# Patient Record
Sex: Female | Born: 1955 | Race: Black or African American | Hispanic: No | Marital: Married | Smoking: Never smoker
Health system: Southern US, Community
[De-identification: ages and names within clinical notes are randomized; demographics above are authoritative.]

## PROBLEM LIST (undated history)

## (undated) DIAGNOSIS — R7303 Prediabetes: Secondary | ICD-10-CM

## (undated) DIAGNOSIS — G4730 Sleep apnea, unspecified: Secondary | ICD-10-CM

## (undated) DIAGNOSIS — J302 Other seasonal allergic rhinitis: Secondary | ICD-10-CM

## (undated) DIAGNOSIS — D649 Anemia, unspecified: Secondary | ICD-10-CM

## (undated) DIAGNOSIS — E785 Hyperlipidemia, unspecified: Secondary | ICD-10-CM

## (undated) HISTORY — PX: OTHER SURGICAL HISTORY: SHX169

## (undated) HISTORY — PX: TONSILLECTOMY: SUR1361

## (undated) HISTORY — PX: TUBAL LIGATION: SHX77

---

## 2002-11-11 ENCOUNTER — Ambulatory Visit (HOSPITAL_COMMUNITY): Admission: RE | Admit: 2002-11-11 | Discharge: 2002-11-11 | Payer: Self-pay | Admitting: Obstetrics and Gynecology

## 2002-11-11 ENCOUNTER — Encounter: Payer: Self-pay | Admitting: Obstetrics and Gynecology

## 2003-11-15 ENCOUNTER — Ambulatory Visit (HOSPITAL_COMMUNITY): Admission: RE | Admit: 2003-11-15 | Discharge: 2003-11-15 | Payer: Self-pay | Admitting: Obstetrics and Gynecology

## 2005-01-02 ENCOUNTER — Ambulatory Visit (HOSPITAL_COMMUNITY): Admission: RE | Admit: 2005-01-02 | Discharge: 2005-01-02 | Payer: Self-pay | Admitting: Obstetrics and Gynecology

## 2005-11-11 ENCOUNTER — Emergency Department (HOSPITAL_COMMUNITY): Admission: EM | Admit: 2005-11-11 | Discharge: 2005-11-11 | Payer: Self-pay | Admitting: Emergency Medicine

## 2006-02-12 HISTORY — PX: COLONOSCOPY: SHX174

## 2006-04-05 ENCOUNTER — Ambulatory Visit (HOSPITAL_COMMUNITY): Admission: RE | Admit: 2006-04-05 | Discharge: 2006-04-05 | Payer: Self-pay | Admitting: Obstetrics and Gynecology

## 2006-04-19 ENCOUNTER — Ambulatory Visit (HOSPITAL_COMMUNITY): Admission: RE | Admit: 2006-04-19 | Discharge: 2006-04-19 | Payer: Self-pay | Admitting: Obstetrics and Gynecology

## 2006-06-12 ENCOUNTER — Ambulatory Visit (HOSPITAL_COMMUNITY): Admission: RE | Admit: 2006-06-12 | Discharge: 2006-06-12 | Payer: Self-pay | Admitting: Gastroenterology

## 2007-01-16 ENCOUNTER — Encounter: Admission: RE | Admit: 2007-01-16 | Discharge: 2007-01-16 | Payer: Self-pay | Admitting: Family Medicine

## 2007-01-24 ENCOUNTER — Encounter: Admission: RE | Admit: 2007-01-24 | Discharge: 2007-01-24 | Payer: Self-pay | Admitting: Family Medicine

## 2007-04-07 ENCOUNTER — Ambulatory Visit (HOSPITAL_COMMUNITY): Admission: RE | Admit: 2007-04-07 | Discharge: 2007-04-07 | Payer: Self-pay | Admitting: Obstetrics and Gynecology

## 2008-02-04 ENCOUNTER — Encounter: Admission: RE | Admit: 2008-02-04 | Discharge: 2008-02-04 | Payer: Self-pay | Admitting: Family Medicine

## 2008-04-01 ENCOUNTER — Inpatient Hospital Stay (HOSPITAL_COMMUNITY): Admission: EM | Admit: 2008-04-01 | Discharge: 2008-04-02 | Payer: Self-pay | Admitting: Emergency Medicine

## 2008-05-19 ENCOUNTER — Ambulatory Visit (HOSPITAL_COMMUNITY): Admission: RE | Admit: 2008-05-19 | Discharge: 2008-05-19 | Payer: Self-pay | Admitting: Obstetrics and Gynecology

## 2008-09-10 ENCOUNTER — Encounter: Admission: RE | Admit: 2008-09-10 | Discharge: 2008-09-10 | Payer: Self-pay | Admitting: Family Medicine

## 2008-09-22 ENCOUNTER — Encounter (INDEPENDENT_AMBULATORY_CARE_PROVIDER_SITE_OTHER): Payer: Self-pay | Admitting: Interventional Radiology

## 2008-09-22 ENCOUNTER — Encounter: Admission: RE | Admit: 2008-09-22 | Discharge: 2008-09-22 | Payer: Self-pay | Admitting: Family Medicine

## 2008-09-22 ENCOUNTER — Other Ambulatory Visit: Admission: RE | Admit: 2008-09-22 | Discharge: 2008-09-22 | Payer: Self-pay | Admitting: Interventional Radiology

## 2009-06-16 ENCOUNTER — Ambulatory Visit (HOSPITAL_COMMUNITY): Admission: RE | Admit: 2009-06-16 | Discharge: 2009-06-16 | Payer: Self-pay | Admitting: Obstetrics and Gynecology

## 2009-07-04 ENCOUNTER — Ambulatory Visit (HOSPITAL_COMMUNITY): Admission: RE | Admit: 2009-07-04 | Discharge: 2009-07-04 | Payer: Self-pay | Admitting: Obstetrics and Gynecology

## 2010-03-04 ENCOUNTER — Encounter: Payer: Self-pay | Admitting: Obstetrics and Gynecology

## 2010-03-05 ENCOUNTER — Encounter: Payer: Self-pay | Admitting: Obstetrics and Gynecology

## 2010-03-05 ENCOUNTER — Encounter: Payer: Self-pay | Admitting: Family Medicine

## 2010-03-05 ENCOUNTER — Encounter: Payer: Self-pay | Admitting: Internal Medicine

## 2010-03-06 ENCOUNTER — Encounter: Payer: Self-pay | Admitting: Family Medicine

## 2010-05-01 ENCOUNTER — Other Ambulatory Visit: Payer: Self-pay | Admitting: Family Medicine

## 2010-05-01 DIAGNOSIS — E049 Nontoxic goiter, unspecified: Secondary | ICD-10-CM

## 2010-05-05 ENCOUNTER — Ambulatory Visit
Admission: RE | Admit: 2010-05-05 | Discharge: 2010-05-05 | Disposition: A | Discharge status: Home / Self Care | Payer: BC Managed Care – PPO | Source: Ambulatory Visit | Attending: Family Medicine | Admitting: Family Medicine

## 2010-05-05 DIAGNOSIS — E049 Nontoxic goiter, unspecified: Secondary | ICD-10-CM

## 2010-05-30 LAB — CARDIAC PANEL(CRET KIN+CKTOT+MB+TROPI)
CK, MB: 1 ng/mL (ref 0.3–4.0)
CK, MB: 1.3 ng/mL (ref 0.3–4.0)
CK, MB: 1.4 ng/mL (ref 0.3–4.0)
Relative Index: 0.7 (ref 0.0–2.5)
Relative Index: 0.8 (ref 0.0–2.5)
Relative Index: 0.8 (ref 0.0–2.5)
Total CK: 143 U/L (ref 7–177)
Total CK: 161 U/L (ref 7–177)
Total CK: 177 U/L (ref 7–177)
Troponin I: <0.01 ng/mL (ref 0.00–0.06)
Troponin I: <0.01 ng/mL (ref 0.00–0.06)
Troponin I: <0.01 ng/mL (ref 0.00–0.06)

## 2010-05-30 LAB — COMPREHENSIVE METABOLIC PANEL
ALT: 28 U/L (ref 0–35)
AST: 19 U/L (ref 0–37)
Albumin: 3.7 g/dL (ref 3.5–5.2)
Alkaline Phosphatase: 62 U/L (ref 39–117)
BUN: 10 mg/dL (ref 6–23)
CO2: 28 mEq/L (ref 19–32)
Calcium: 9.3 mg/dL (ref 8.4–10.5)
Chloride: 103 mEq/L (ref 96–112)
Creatinine, Ser: 0.78 mg/dL (ref 0.4–1.2)
GFR calc Af Amer: >60 mL/min (ref >60)
GFR calc non Af Amer: >60 mL/min (ref >60)
Glucose, Bld: 89 mg/dL (ref 70–99)
Potassium: 3.8 mEq/L (ref 3.5–5.1)
Sodium: 137 mEq/L (ref 135–145)
Total Bilirubin: 0.5 mg/dL (ref 0.3–1.2)
Total Protein: 6.5 g/dL (ref 6.0–8.3)

## 2010-05-30 LAB — POCT I-STAT, CHEM 8
BUN: 10 mg/dL (ref 6–23)
Calcium, Ion: 1.15 mmol/L (ref 1.12–1.32)
Chloride: 105 mEq/L (ref 96–112)
Creatinine, Ser: 0.9 mg/dL (ref 0.4–1.2)
Glucose, Bld: 90 mg/dL (ref 70–99)
HCT: 41 % (ref 36.0–46.0)
Hemoglobin: 13.9 g/dL (ref 12.0–15.0)
Potassium: 4.2 mEq/L (ref 3.5–5.1)
Sodium: 140 mEq/L (ref 135–145)
TCO2: 27 mmol/L (ref 0–100)

## 2010-05-30 LAB — PROTIME-INR
INR: 1 (ref 0.00–1.49)
INR: 1 (ref 0.00–1.49)
Prothrombin Time: 13 seconds (ref 11.6–15.2)
Prothrombin Time: 13.3 seconds (ref 11.6–15.2)

## 2010-05-30 LAB — CBC
HCT: 38.6 % (ref 36.0–46.0)
HCT: 40.1 % (ref 36.0–46.0)
Hemoglobin: 12.8 g/dL (ref 12.0–15.0)
Hemoglobin: 13.4 g/dL (ref 12.0–15.0)
MCHC: 33.1 g/dL (ref 30.0–36.0)
MCHC: 33.5 g/dL (ref 30.0–36.0)
MCV: 81.1 fL (ref 78.0–100.0)
MCV: 81.2 fL (ref 78.0–100.0)
Platelets: 255 10*3/uL (ref 150–400)
Platelets: 271 10*3/uL (ref 150–400)
RBC: 4.76 MIL/uL (ref 3.87–5.11)
RBC: 4.94 MIL/uL (ref 3.87–5.11)
RDW: 14.4 % (ref 11.5–15.5)
RDW: 15.3 % (ref 11.5–15.5)
WBC: 5.8 10*3/uL (ref 4.0–10.5)
WBC: 6.9 10*3/uL (ref 4.0–10.5)

## 2010-05-30 LAB — DIFFERENTIAL
Basophils Absolute: 0 10*3/uL (ref 0.0–0.1)
Basophils Relative: 0 % (ref 0–1)
Eosinophils Absolute: 0.2 10*3/uL (ref 0.0–0.7)
Eosinophils Relative: 3 % (ref 0–5)
Lymphocytes Relative: 43 % (ref 12–46)
Lymphs Abs: 2.9 10*3/uL (ref 0.7–4.0)
Monocytes Absolute: 0.5 10*3/uL (ref 0.1–1.0)
Monocytes Relative: 7 % (ref 3–12)
Neutro Abs: 3.2 10*3/uL (ref 1.7–7.7)
Neutrophils Relative %: 47 % (ref 43–77)

## 2010-05-30 LAB — HEMOGLOBIN A1C
Hgb A1c MFr Bld: 6.3 % — ABNORMAL HIGH (ref 4.6–6.1)
Mean Plasma Glucose: 134 mg/dL

## 2010-05-30 LAB — LIPID PANEL
Cholesterol: 219 mg/dL — ABNORMAL HIGH (ref 0–200)
HDL: 50 mg/dL (ref >39)
LDL Cholesterol: 150 mg/dL — ABNORMAL HIGH (ref 0–99)
Total CHOL/HDL Ratio: 4.4 RATIO
Triglycerides: 93 mg/dL (ref <150)
VLDL: 19 mg/dL (ref 0–40)

## 2010-05-30 LAB — POCT CARDIAC MARKERS
CKMB, poc: <1 ng/mL — ABNORMAL LOW (ref 1.0–8.0)
Myoglobin, poc: 83.5 ng/mL (ref 12–200)
Troponin i, poc: <0.05 ng/mL (ref 0.00–0.09)

## 2010-05-30 LAB — CK TOTAL AND CKMB (NOT AT ARMC)
CK, MB: 1.8 ng/mL (ref 0.3–4.0)
Relative Index: 0.7 (ref 0.0–2.5)
Total CK: 243 U/L — ABNORMAL HIGH (ref 7–177)

## 2010-05-30 LAB — APTT: aPTT: 35 seconds (ref 24–37)

## 2010-05-30 LAB — TSH: TSH: 0.91 u[IU]/mL (ref 0.350–4.500)

## 2010-05-30 LAB — BRAIN NATRIURETIC PEPTIDE: Pro B Natriuretic peptide (BNP): <30 pg/mL (ref 0.0–100.0)

## 2010-05-30 LAB — D-DIMER, QUANTITATIVE: D-Dimer, Quant: 0.31 ug/mL-FEU (ref 0.00–0.48)

## 2010-05-30 LAB — TROPONIN I: Troponin I: 0.01 ng/mL (ref 0.00–0.06)

## 2010-06-27 NOTE — H&P (Signed)
NAMEALEXANDRA, [name] NO.:  [id number]   MEDICAL RECORD NO.:  [id number]          PATIENT TYPE:  EMS   LOCATION:  ED                           FACILITY[facility]  [facility]   PHYSICIAN:  Michiel Cowboy, MDDATE OF BIRTH:  [birth year]   DATE OF ADMISSION:  04/01/2008  DATE OF DISCHARGE:                              HISTORY & PHYSICAL   PRIMARY [name]:  Dr. Donia Guiles.   CHIEF COMPLAINT:  Chest pain.   The patient is a 55 year old female with past medical history  significant for chronic sinusitis.  The patient was in her baseline of  health up until she woke up this a.m. not feeling quite herself,  uncomfortable.  She noticed that she had pressure-like sensation mainly  over the right side of her chest which did not improve until she came to  the emergency department where she just received aspirin and  nitroglycerin, as well as 100 of fentanyl and 30 of Toradol, which made  her chest pain free.  She denies any nausea or vomiting.  No  diaphoresis.  No radiation of her pain.  She never has pain or shortness  of breath when she exercises and has not had any shortness of breath  associated with this pain.   SOCIAL HISTORY:  She never smoked and does not use drugs.  Occasional  drinker.   FAMILY HISTORY:  Father with myocardial infarction in his 71s.  No other  family members with coronary artery disease.   ALLERGIES:  NO KNOWN DRUG ALLERGIES.   MEDICATIONS:  Allegra D daily.   PHYSICAL EXAMINATION:  VITAL SIGNS:  Temperature 98.2, blood pressure  156/86, pulse 81, respirations 20.  Saturating 97% on room air.  GENERAL:  The patient appears to be currently in no acute distress,  slightly obese female, lying down on the bed.  HEENT:  Head nontraumatic.  Moist mucous membranes.  LUNGS:  Clear to auscultation bilaterally.  HEART:  Regular rate and rhythm.  No murmurs, rubs or gallops.  ABDOMEN:  Soft, nontender, nondistended.  Slightly obese.  LOWER EXTREMITIES:   No clubbing, cyanosis or edema.  NEUROLOGIC:  The patient appears to be intact.   LABORATORY DATA:  White blood cell count 6.9, hemoglobin 13.9, sodium  140, potassium 4.2, creatinine 0.9.  Cardiac enzymes are negative.  D-  dimer is 0.31.  EKG showing T-wave inversions in lead 3, aVF, V3-V6.  No  old EKGs available.  Chest x-ray showed no cardiopulmonary disease.   ASSESSMENT/PLAN:  1. This is a 55 year old female with risk factors concerning family      history of coronary artery disease presents with somewhat atypical      chest pain occurring at rest.  We will cycle cardiac enzymes, check      fasting lipid panel, hemoglobin A1c, TSH.  We will do serial EKGs.      We will consider cardiology consult given family history of      coronary artery disease and pressure-like sensation to see if a      stress test can be arranged sooner or later.  2. Elevated blood pressure.  This was an initial reading.  On repeat      reading it came down to 120s.  Will follow.  The patient has no      known history of hypertension.  3. Chronic sinus drainage.  Will change to Allegra D while here with      chest pain to avoid pseudoephedrine.  4. Prophylaxis.  Protonix plus Lovenox.  Will admit to telemetry.      Michiel Cowboy, MD  Electronically Signed     AVD/MEDQ  D:  04/01/2008  T:  04/01/2008  Job:  [id number]   cc:   Donia Guiles, M.D.  Fax: [phone]

## 2010-06-27 NOTE — Consult Note (Signed)
[name], [name] NO.:  [id number]   MEDICAL RECORD NO.:  [id number]          PATIENT TYPE:  INP   LOCATION:  0102                         FACILITY:  [facility]   PHYSICIAN:  Jake Bathe, MD      DATE OF BIRTH:  01/16/1956   DATE OF CONSULTATION:  04/01/2008  DATE OF DISCHARGE:                                 CONSULTATION   REQUESTING PHYSICIAN:  Dr. Ramiro Snyder of [place] [facility].   PRIMARY CARE PHYSICIAN:  [name], M.D. of [facility].   REASON FOR CONSULTATION:  [name] is being seen at the request of Dr.  Janee Snyder for the evaluation of chest pain and abnormal EKG.   HISTORY OF PRESENT ILLNESS:  [name] with family  history of coronary artery disease with her father having an MI and  bypass surgery in his early 14s with hypertension today, normal  cholesterol per the patient, no smoking, no diabetes who was admitted  today after waking up with severe 9/10 right-sided chest discomfort that  was worse with sitting up, associated with no diaphoresis, no nausea,  vomiting, no shortness of breath.  She experienced similar pain  previously but it was quite mild in intensity and is quite rare.  This  pain was non exertional.  When she got to the emergency department she  was given nitroglycerin, fentanyl, Toradol which relieved her chest  discomfort.  Her ECG was noteworthy for nonspecific ST-T wave changes  with T-wave inversions noted in leads II, III, AVF as well as V3, V4, V5  and V6.  These T-wave inversions were subtle.  The subsequent ECG showed  similar findings.  There is no old EKG for comparison.  Currently she  has very mild discomfort and is quite anxious about getting a room.  She  states that in her life right now there has been severe increased stress  and she feels as though this may be attributing to her chest pain.   PAST MEDICAL HISTORY:  1. Possible hypertension.  2. Chronic sinusitis.   MEDICATIONS:  Allegra-D only.   ALLERGIES:  No known drug allergies.   FAMILY HISTORY:  Father myocardial infarction in his 16s.   SOCIAL HISTORY:  No tobacco, alcohol or illicit drug use.  She works for  [employer].   REVIEW OF SYSTEMS:  Increased emotional stress. No bleeding, no syncope,  no rash, no orthopnea. Unless stated above, all other 12 review of  systems negative.   PHYSICAL EXAM:  Blood pressure on arrival was 156/86, pulse 81,  respirations 20, satting 97% on room air.  GENERAL:  Alert, oriented x3 in no acute distress.  Here with her  husband at bedside.  EYES:  Well-perfused conjunctivae.  EOMI.  No scleral icterus noted.  NECK:  Supple.  No lymphadenopathy.  No JVD.  No carotid bruits.  No  thyromegaly.  Moist mucous membranes.  CARDIOVASCULAR:  Regular rate and rhythm with no murmurs, rubs or  gallops appreciated.  Normal PMI.  LUNGS:  Clear to auscultation bilaterally.  Normal respiratory effort.  No wheezes.  No rales.  ABDOMEN:  Soft, nontender, normoactive bowel sounds.  Mildly obese.  No  bruits.  EXTREMITIES:  No clubbing, cyanosis or edema.  Normal distal pulses.  SKIN:  Warm, dry, intact.  No rashes.  NEUROLOGIC:  Nonfocal.  No tremors.  PSYCH:  Normal affect.   DATA:  ECG personally viewed as described in HPI shows sinus rhythm with  subtle T-wave inversion in lead II, III, F as well as V3, V4, V5, V6.  D-  dimer was normal at 0.31.  White count of 6.9, hemoglobin 13.9,  platelets 278.  Sodium 140, potassium 4.2, creatinine 0.9.  Chest x-ray  personally viewed showed no acute airspace disease.  First set of  cardiac biomarkers are normal.  This was drawn at 6:00 a.m.   ASSESSMENT/PLAN:  [name] with possible hypertension,  strong family history of coronary artery disease with atypical chest  pain.  1. Atypical chest pain - encouraged her to stay here for complete set      of evaluation of cardiac biomarkers every 8  hours x3.  I do agree      with Lovenox deep vein thrombosis prophylaxis and would change this      if her biomarkers increase.  Currently her chest pain story is      quite atypical and is likely secondary to stress episode and likely      musculoskeletal.  However, cardiac etiology needs to be ruled out.      If she does have unremarkable cardiac biomarkers and her ECG      remains unchanged in the morning, I would be fine with discharging      the patient for outpatient stress evaluation.  Of course if she      develops any more worrisome symptoms or any other worrisome      changes,  this evaluation or cardiac catheterization can be      performed more promptly or urgently.  2. Hypertension - her blood pressure was elevated.  This may be pain      response.  I will go ahead and give her metoprolol not only for      possible ischemia but also to help control her blood pressure.  3. Family history of coronary artery disease - quite strong as notated      above.  Waiting on fasting lipid profile.  Aggressively modify      other risk factors.      Jake Bathe, MD  Electronically Signed     MCS/MEDQ  D:  04/01/2008  T:  04/01/2008  Job:  [id number]   cc:   [name] Harvest, MD   [name], M.D.  Fax: [phone]

## 2010-06-30 NOTE — Op Note (Signed)
NAMECHRYSTEL, [name] NO.:  [id number]   MEDICAL RECORD NO.:  [id number]          PATIENT TYPE:  AMB   LOCATION:  ENDO                         FACILITY:  [facility]   PHYSICIAN:  Anselmo Rod, M.D.  DATE OF BIRTH:  1955/05/04   DATE OF PROCEDURE:  DATE OF DISCHARGE:                               OPERATIVE REPORT   DATE OF PROCEDURE:  June 12, 2006.   PROCEDURE PERFORMED:  Screening colonoscopy.   ENDOSCOPIST:  Anselmo Rod, M.D.   INSTRUMENT USED:  Pentax video colonoscope.   INDICATIONS FOR PROCEDURE:  A 55 year old African-American female  undergoing screening colonoscopy to rule out colonic polyps, masses,  etc.   PRE-PROCEDURE PREPARATION:  Informed consent was secured from the  patient.  The patient fasted for eight hours prior to the procedure and  took a bottle of magnesium citrate and a gallon of NuLYTELY the night  prior to the procedure.  The risks and benefits of the procedure,  including a 10% missed rate of cancer and polyps were discussed with the  patient as well.   PRE-PROCEDURE PHYSICAL:  The patient had stable vital signs.  Neck  supple.  Chest clear to auscultation.  S1 and S2, regular.  Abdomen soft  with normal bowel sounds.   DESCRIPTION OF PROCEDURE:  The patient was placed in the left lateral  decubitus position and sedated with 100 micrograms of Fentanyl and 10  milligrams of Versed. Once the patient was adequately sedated and  maintained on low flow oxygen and continuous cardiac monitoring, the  Pentax video colonoscope was advanced from the rectum to the cecum. The  patient's position was changed from the left lateral decubitus position  to supine position with gentle application of abdominal pressure to  reach the cecal base. The appendiceal orifice and ileocecal valve were  clearly visualized and photographed. The terminal ileum was visualized  without lesions. No masses, polyps, ulcerations or diverticula were  seen. The  scope was retroflexed in the rectum and revealed no  abnormalities. Some polypoid lymphoid aggregates were noted in the  terminal ileum. The patient tolerated the procedure well without  immediate complications.   IMPRESSION:  1. Normal colonoscopy to terminal ileum. No masses, polyps or      diverticula seen.  2. No abnormalities noted on retroflexion.   RECOMMENDATIONS:  1. Continue high fiber diet with liberal fluid intake.  2. Use stool softeners as needed with bouts of constipation.  3. Use MiraLax if the Colace does not work.  4. Outpatient followup as need arises in the future with repeat      colonoscopy as need arises in next ten years. If the patient has      any abnormal symptoms in the interim she has been advised to      contact the office immediately for further recommendations.      Anselmo Rod, M.D.  Electronically Signed     JNM/MEDQ  D:  06/12/2006  T:  06/12/2006  Job:  [id number]   cc:   Arlyce Harman

## 2010-06-30 NOTE — Discharge Summary (Signed)
[name], [name] NO.:  [id number]   MEDICAL RECORD NO.:  [id number]          PATIENT TYPE:  INP   LOCATION:  1426                         [facility]AC[facility]   PHYSICIAN:  [name] Harvest, MD    DATE OF BIRTH:  05/09/1955   DATE OF ADMISSION:  04/01/2008  DATE OF DISCHARGE:  04/02/2008                               DISCHARGE SUMMARY   ATTENDING PHYSICIAN:  Dr. Ramiro Snyder.   PRIMARY CARE PHYSICIAN:  [name] Guiles, MD, of [facility].   CARDIOLOGIST:  [name] C. [name] Fu, MD, of [facility].   DISCHARGE DIAGNOSES:  1. Atypical chest pain.  2. Hypertension.  3. Chronic sinusitis.   DISCHARGE MEDICATIONS:  1. Allegra D 180 mg p.o. daily p.r.n.  2. Metoprolol 25 mg p.o. b.i.d.   DISCHARGE DISPOSITION AND FOLLOWUP:  The patient will be discharged  home.  The patient is to follow up with Dr. Anne Snyder on April 12, 2008 at 9  a.m. for an outpatient stress test.  At that time a 2-D echo will be  done.  The patient is advised not to take her beta-blockers 48 hours  prior to the test.  The patient is to follow up with her PCP in 2 weeks.  On followup, the patient's blood pressure needs to be reassessed as the  patient was started on metoprolol for her blood pressure.  The patient's  cholesterol needs to be assessed as an outpatient per her PCP and may  consider starting the patient on a statin or lifestyle and dietary  changes.   CONSULTATIONS DONE.:  A cardiology consult was done.  The patient was  seen in consultation by Dr. Anne Snyder of [facility] on April 01, 2008.   PROCEDURES PERFORMED.:  A chest x-ray was done on April 01, 2008 that  showed no active cardiopulmonary disease.   ADMISSION HISTORY AND PHYSICAL:  [name] is a 55 year old  female with a past medical history of chronic sinusitis, also a positive  family history of coronary artery disease, who was at her baseline of  health up until she woke up on the morning of  admission not feeling  quite herself and an uncomfortable.  The patient noted she had a  pressure-like sensation mainly over the right side of the chest which  did not improve until she came to the emergency room where she received  receive aspirin and nitroglycerin as well as 100 mg of fentanyl and 30  mg of Toradol which made her chest pain free.  The patient denied any  nausea, vomiting or diaphoresis.  No radiation of the pain.  She never  has pain or shortness of breath when she exercises and has had no  shortness of breath associated with this pain.   PHYSICAL EXAM:  Per admitting physician, temperature 98.2, blood  pressure 156/86, pulse of 81, respirations 20, saturating 97% on room  air.  GENERAL:  The patient is in no acute distress.  A slightly obese female  lying in the bed.  HEENT:  Normocephalic, atraumatic.  Pupils equal, round and reactive to  light.  Extraocular movements intact.  Oropharynx was clear.  No  lesions.  No exudates.  Neck was supple.  No lymphadenopathy.  Moist mucous membranes.  Lungs were clear to auscultation bilaterally.  CARDIOVASCULAR:  Regular rate and rhythm.  No murmurs, rubs or gallops.  Abdomen was soft, nontender, nondistended, positive bowel sounds.  EXTREMITIES:  No clubbing, cyanosis or edema.  Neurologically the patient was alert and oriented x3.  Cranial nerves II-  XII were grossly intact.  No focal deficits.   ADMISSION LABS:  CBC:  White count 6.9, hemoglobin 13.9.  Sodium 140,  potassium 4.2, creatinine 0.9.  Point-of-care cardiac enzymes were  negative x1.  D-dimer was 0.31.  EKG showed T-wave inversions in leads  II, III, aVF and V3 through V6.  No old EKGs were available.  Chest x-  ray showed no acute cardiopulmonary disease.   HOSPITAL COURSE:  1. Chest pain.  The patient was admitted with chest pain as the      patient had a strong family history of coronary artery disease,      however, the patient's chest pain was somewhat  atypical at rest.      Cardiac enzymes were cycled, which came back negative.  A      hemoglobin A1c obtained was at 6.3.  A TSH obtained was within      normal limits of 0.910.  A fasting lipid panel showed a total      cholesterol of 219, triglycerides of 93, HDL of 50, LDL of 150.  A      BNP obtained was less than 30.  A comprehensive metabolic profile      obtained was within normal limits.  The patient remained chest pain      free throughout the hospitalization.  Secondary to her T-wave      changes on her EKG, cardiology was consulted.  The patient was seen      in consultation by Dr. Anne Snyder on April 01, 2008 and it was felt      reasonable to admit the patient and cycle cardiac enzymes and      repeat EKG.  Repeat EKG was unchanged from her admission EKG.      Cardiac enzymes came back negative.  The patient was placed on      metoprolol during the hospitalization.  The patient remained chest      pain free throughout the hospitalization.  Cardiac enzymes were      cycled and came back negative.  The patient was chest pain free.      It was felt that the patient was a low risk and would benefit from      an outpatient stress test.  The patient was scheduled for am      outpatient stress test on April 12, 2008 at 9 a.m. to be seen by Dr.      Anne Snyder in the outpatient office for further evaluation.  The      patient was discharged in stable and improved condition and will      follow up as stated above.  It was felt the patient's chest pain      could be secondary to stress induced versus musculoskeletal.  2. Hypertension.  It was noted on admission the patient was slightly      hypertensive.  The patient was started on metoprolol for this.  The      patient will be discharged on metoprolol, however, the patient is  to stop the metoprolol 48 hours prior to her outpatient stress      test.  The patient will follow up with her PCP in regard to her      hypertension for further  management.  On the day of discharge the      patient was in stable and improved condition.   VITAL SIGNS ON THE DAY OF DISCHARGE:  Temperature 98.4, pulse of 79,  blood pressure 135/69, respirations 18, saturating 99% on room air.   DISCHARGE LABS:  Sodium 137, potassium 3.8, chloride 103, bicarb 28, BUN  10, creatinine 0.78, glucose of 89.  Calcium of 9.3, bilirubin 0.5, alk  phosphatase 62, AST 19, ALT 28, albumin 3.7, protein of 6.5.  CBC with a  white  count of 5.8, hemoglobin of 13.4, hematocrit of 40.1, platelets of 255.  Cholesterol of April 02, 2008 triglycerides of 93, HDL of 50, LDL of  150.   It was a pleasure taking care of [name].      [name] Harvest, MD  Electronically Signed     DT/MEDQ  D:  04/29/2008  T:  04/29/2008  Job:  [id number]   cc:   [name], M.D.  Fax: [phone]   Jake Bathe, MD  Fax: [phone]

## 2010-07-04 ENCOUNTER — Other Ambulatory Visit: Payer: Self-pay | Admitting: Obstetrics and Gynecology

## 2010-07-04 DIAGNOSIS — Z1231 Encounter for screening mammogram for malignant neoplasm of breast: Secondary | ICD-10-CM

## 2010-07-10 ENCOUNTER — Other Ambulatory Visit: Payer: Self-pay | Admitting: Obstetrics and Gynecology

## 2010-07-10 ENCOUNTER — Other Ambulatory Visit (HOSPITAL_COMMUNITY)
Admission: RE | Admit: 2010-07-10 | Discharge: 2010-07-10 | Disposition: A | Discharge status: Home / Self Care | Payer: BC Managed Care – PPO | Source: Ambulatory Visit | Attending: Obstetrics and Gynecology | Admitting: Obstetrics and Gynecology

## 2010-07-10 DIAGNOSIS — Z01419 Encounter for gynecological examination (general) (routine) without abnormal findings: Secondary | ICD-10-CM | POA: Insufficient documentation

## 2010-07-13 ENCOUNTER — Ambulatory Visit (HOSPITAL_COMMUNITY)
Admission: RE | Admit: 2010-07-13 | Discharge: 2010-07-13 | Disposition: A | Discharge status: Home / Self Care | Payer: BC Managed Care – PPO | Source: Ambulatory Visit | Attending: Obstetrics and Gynecology | Admitting: Obstetrics and Gynecology

## 2010-07-13 DIAGNOSIS — Z1231 Encounter for screening mammogram for malignant neoplasm of breast: Secondary | ICD-10-CM | POA: Insufficient documentation

## 2011-04-13 HISTORY — PX: DILATION AND CURETTAGE OF UTERUS: SHX78

## 2011-04-27 ENCOUNTER — Other Ambulatory Visit: Payer: Self-pay | Admitting: Obstetrics and Gynecology

## 2011-05-25 ENCOUNTER — Other Ambulatory Visit (HOSPITAL_COMMUNITY): Payer: Self-pay | Admitting: Obstetrics and Gynecology

## 2011-05-25 ENCOUNTER — Encounter (HOSPITAL_COMMUNITY): Payer: Self-pay | Admitting: *Deleted

## 2011-05-25 ENCOUNTER — Encounter (HOSPITAL_COMMUNITY): Payer: Self-pay | Admitting: Pharmacist

## 2011-05-25 NOTE — H&P (Signed)
[name] is an 56 y.o. female.gravida 3 para 2 A1 with worsening menorrhagia. The patient is perimenopausal. She underwent a hysteroscopy and D&C . March 15. 2013,however bleeding has worsened.she is now having heavy vaginal bleeding changing a pad every one to 2 hours. Options for treatment open and the patient has requested ThermaChoice ablation. Risks possible complications and alternative therapies have been discussed. Informed consent has been given. Pertinent Gynecological History: Menses: flow is excessive with use of many pads or tampons on heaviest days Bleeding: heavy Contraception: tubal ligation DES exposure: denies Blood transfusions: none Sexually transmitted diseases: no past history Previous GYN Procedures: hysteroscopy and D&C 04/27/2011  Last mammogram: normal Date: May 2012 Last pap: normal Date: May 2012   Menstrual History: No LMP recorded. Patient is postmenopausal.last menstrual. Was 05/16/2011    No past medical history on file.  past medical history   illnesses none   family history mother has diabetes hypertension and on has breast cancer  surgeries tubal ligation and she had a hysteroscopy and D&C   social history is noncontributory   medications Zyrtec vitamin D and folic acid.      Social History:  does not have a smoking history on file. She does not have any smokeless tobacco history on file. Her alcohol and drug histories not on file.  Allergies: none    Review of Systems  Constitutional: Negative.   HENT: Negative.   Eyes: Negative.   Respiratory: Negative.   Cardiovascular: Negative.   Gastrointestinal: Negative.   Genitourinary: Negative.   Musculoskeletal: Negative.   Neurological: Negative.   Endo/Heme/Allergies: Negative.   Psychiatric/Behavioral: Negative.     noncontributory  There were no vitals taken for this visit. Physical Exam  Constitutional: She is oriented to person, place, and time. She appears  well-developed and well-nourished.  HENT:  Head: Normocephalic.  Eyes: Right eye exhibits no discharge. Left eye exhibits no discharge. No scleral icterus.  Neck: Normal range of motion. Neck supple. No tracheal deviation present. No thyromegaly present.  Cardiovascular: Normal rate and regular rhythm.  Exam reveals no gallop and no friction rub.   No murmur heard. Respiratory: Effort normal and breath sounds normal. She has no rales. She exhibits no tenderness.  GI: Soft. Bowel sounds are normal.  Genitourinary: Vagina normal and uterus normal.  Musculoskeletal: Normal range of motion.  Neurological: She is alert and oriented to person, place, and time. She has normal reflexes.  Skin: Skin is warm and dry.  Psychiatric: She has a normal mood and affect. Her behavior is normal. Judgment and thought content normal.    No results found for this or any previous visit (from the past 24 hour(s)).  No results found.  Assessment/Plan: Perimenopausal female with severe menorrhagia unresponsive to hysteroscopy and D&C ThermaChoice ablation  Cristle Jared E 05/25/2011, 8:29 AM

## 2011-05-27 MED ORDER — CEFAZOLIN SODIUM-DEXTROSE 2-3 GM-% IV SOLR
2.0000 g | INTRAVENOUS | Status: AC
  Administered 2011-05-28: 2 g via INTRAVENOUS
  Filled 2011-05-27 (×2): qty 50

## 2011-05-28 ENCOUNTER — Ambulatory Visit (HOSPITAL_COMMUNITY)
Admission: RE | Admit: 2011-05-28 | Discharge: 2011-05-28 | Disposition: A | Discharge status: Home / Self Care | Payer: BC Managed Care – PPO | Source: Ambulatory Visit | Attending: Obstetrics and Gynecology | Admitting: Obstetrics and Gynecology

## 2011-05-28 ENCOUNTER — Encounter (HOSPITAL_COMMUNITY): Payer: Self-pay | Admitting: Anesthesiology

## 2011-05-28 ENCOUNTER — Ambulatory Visit (HOSPITAL_COMMUNITY): Payer: BC Managed Care – PPO | Admitting: Anesthesiology

## 2011-05-28 ENCOUNTER — Encounter (HOSPITAL_COMMUNITY): Payer: Self-pay | Admitting: *Deleted

## 2011-05-28 ENCOUNTER — Encounter (HOSPITAL_COMMUNITY)
Admission: RE | Disposition: A | Discharge status: Home / Self Care | Payer: Self-pay | Source: Ambulatory Visit | Attending: Obstetrics and Gynecology

## 2011-05-28 DIAGNOSIS — Z9889 Other specified postprocedural states: Secondary | ICD-10-CM

## 2011-05-28 DIAGNOSIS — N840 Polyp of corpus uteri: Secondary | ICD-10-CM | POA: Insufficient documentation

## 2011-05-28 DIAGNOSIS — N920 Excessive and frequent menstruation with regular cycle: Secondary | ICD-10-CM | POA: Insufficient documentation

## 2011-05-28 HISTORY — DX: Anemia, unspecified: D64.9

## 2011-05-28 HISTORY — DX: Other seasonal allergic rhinitis: J30.2

## 2011-05-28 HISTORY — PX: POLYPECTOMY: SHX5525

## 2011-05-28 LAB — CBC
HCT: 29.1 % — ABNORMAL LOW (ref 36.0–46.0)
Hemoglobin: 9.2 g/dL — ABNORMAL LOW (ref 12.0–15.0)
MCH: 25.4 pg — ABNORMAL LOW (ref 26.0–34.0)
MCHC: 31.6 g/dL (ref 30.0–36.0)
MCV: 80.4 fL (ref 78.0–100.0)
Platelets: 362 10*3/uL (ref 150–400)
RBC: 3.62 MIL/uL — ABNORMAL LOW (ref 3.87–5.11)
RDW: 14.9 % (ref 11.5–15.5)
WBC: 7.3 10*3/uL (ref 4.0–10.5)

## 2011-05-28 SURGERY — DILATATION & CURETTAGE/HYSTEROSCOPY WITH RESECTOCOPE
Anesthesia: General | Site: Vagina | Wound class: Clean Contaminated

## 2011-05-28 MED ORDER — FENTANYL CITRATE 0.05 MG/ML IJ SOLN
INTRAMUSCULAR | Status: DC | PRN
  Administered 2011-05-28: 100 ug via INTRAVENOUS
  Administered 2011-05-28 (×3): 50 ug via INTRAVENOUS

## 2011-05-28 MED ORDER — DEXAMETHASONE SODIUM PHOSPHATE 4 MG/ML IJ SOLN
INTRAMUSCULAR | Status: DC | PRN
  Administered 2011-05-28: 10 mg via INTRAVENOUS

## 2011-05-28 MED ORDER — FENTANYL CITRATE 0.05 MG/ML IJ SOLN
INTRAMUSCULAR | Status: AC
  Administered 2011-05-28: 50 ug via INTRAVENOUS
  Filled 2011-05-28: qty 2

## 2011-05-28 MED ORDER — MIDAZOLAM HCL 2 MG/2ML IJ SOLN
INTRAMUSCULAR | Status: AC
  Filled 2011-05-28: qty 2

## 2011-05-28 MED ORDER — ONDANSETRON HCL 4 MG/2ML IJ SOLN
INTRAMUSCULAR | Status: AC
  Filled 2011-05-28: qty 2

## 2011-05-28 MED ORDER — LIDOCAINE HCL (CARDIAC) 20 MG/ML IV SOLN
INTRAVENOUS | Status: DC | PRN
  Administered 2011-05-28: 100 mg via INTRAVENOUS

## 2011-05-28 MED ORDER — LACTATED RINGERS IV SOLN
INTRAVENOUS | Status: DC
  Administered 2011-05-28: 125 mL/h via INTRAVENOUS
  Administered 2011-05-28: 07:00:00 via INTRAVENOUS

## 2011-05-28 MED ORDER — DEXTROSE 5 % IV SOLN
INTRAVENOUS | Status: DC | PRN
  Administered 2011-05-28: 45 mL via INTRAVENOUS

## 2011-05-28 MED ORDER — ONDANSETRON HCL 4 MG/2ML IJ SOLN
INTRAMUSCULAR | Status: DC | PRN
  Administered 2011-05-28: 4 mg via INTRAVENOUS

## 2011-05-28 MED ORDER — LIDOCAINE HCL (CARDIAC) 20 MG/ML IV SOLN
INTRAVENOUS | Status: AC
  Filled 2011-05-28: qty 5

## 2011-05-28 MED ORDER — MIDAZOLAM HCL 5 MG/5ML IJ SOLN
INTRAMUSCULAR | Status: DC | PRN
  Administered 2011-05-28: 2 mg via INTRAVENOUS

## 2011-05-28 MED ORDER — PROPOFOL 10 MG/ML IV EMUL
INTRAVENOUS | Status: DC | PRN
  Administered 2011-05-28: 200 mg via INTRAVENOUS

## 2011-05-28 MED ORDER — FENTANYL CITRATE 0.05 MG/ML IJ SOLN
INTRAMUSCULAR | Status: AC
  Filled 2011-05-28: qty 2

## 2011-05-28 MED ORDER — FENTANYL CITRATE 0.05 MG/ML IJ SOLN
25.0000 ug | INTRAMUSCULAR | Status: DC | PRN
  Administered 2011-05-28: 50 ug via INTRAVENOUS

## 2011-05-28 MED ORDER — DEXAMETHASONE SODIUM PHOSPHATE 10 MG/ML IJ SOLN
INTRAMUSCULAR | Status: AC
  Filled 2011-05-28: qty 1

## 2011-05-28 MED ORDER — KETOROLAC TROMETHAMINE 30 MG/ML IJ SOLN
INTRAMUSCULAR | Status: DC | PRN
  Administered 2011-05-28: 30 mg via INTRAVENOUS

## 2011-05-28 MED ORDER — KETOROLAC TROMETHAMINE 30 MG/ML IJ SOLN
INTRAMUSCULAR | Status: AC
  Filled 2011-05-28: qty 1

## 2011-05-28 MED ORDER — PROPOFOL 10 MG/ML IV EMUL
INTRAVENOUS | Status: AC
  Filled 2011-05-28: qty 20

## 2011-05-28 SURGICAL SUPPLY — 15 items
CANISTER SUCTION 2500CC (MISCELLANEOUS) ×4 IMPLANT
CATH ROBINSON RED A/P 16FR (CATHETERS) ×4 IMPLANT
CATH THERMACHOICE III (CATHETERS) ×4 IMPLANT
CLOTH BEACON ORANGE TIMEOUT ST (SAFETY) ×4 IMPLANT
CONTAINER PREFILL 10% NBF 60ML (FORM) ×6 IMPLANT
DILATOR CANAL MILEX (MISCELLANEOUS) IMPLANT
GLOVE BIO SURGEON STRL SZ7 (GLOVE) ×4 IMPLANT
GLOVE BIOGEL PI IND STRL 7.0 (GLOVE) ×6 IMPLANT
GLOVE BIOGEL PI INDICATOR 7.0 (GLOVE) ×2
GOWN PREVENTION PLUS LG XLONG (DISPOSABLE) ×4 IMPLANT
GOWN PREVENTION PLUS XLARGE (GOWN DISPOSABLE) ×4 IMPLANT
GOWN STRL REIN XL XLG (GOWN DISPOSABLE) ×4 IMPLANT
PACK HYSTEROSCOPY LF (CUSTOM PROCEDURE TRAY) ×4 IMPLANT
TOWEL OR 17X24 6PK STRL BLUE (TOWEL DISPOSABLE) ×8 IMPLANT
WATER STERILE IRR 1000ML POUR (IV SOLUTION) ×4 IMPLANT

## 2011-05-28 NOTE — Anesthesia Procedure Notes (Signed)
Procedure Name: LMA Insertion Date/Time: 05/28/2011 7:47 AM Performed by: Aldair Rickel, Jannet Askew Pre-anesthesia Checklist: Patient identified, Patient being monitored, Emergency Drugs available, Timeout performed and Suction available Patient Re-evaluated:Patient Re-evaluated prior to inductionOxygen Delivery Method: Circle system utilized Preoxygenation: Pre-oxygenation with 100% oxygen Intubation Type: IV induction Ventilation: Mask ventilation without difficulty LMA: LMA inserted LMA Size: 4.0 Grade View: Grade I Tube type: Oral Number of attempts: 1

## 2011-05-28 NOTE — H&P (Signed)
H & P updated. Unchanged.  Patient with no complaints.

## 2011-05-28 NOTE — Anesthesia Preprocedure Evaluation (Signed)

## 2011-05-28 NOTE — Anesthesia Postprocedure Evaluation (Signed)
Anesthesia Post Note  Patient: [name]  Procedure(s) Performed: Procedure(s) (LRB): DILATATION & CURETTAGE/HYSTEROSCOPY WITH RESECTOCOPE () POLYPECTOMY (N/A)  Anesthesia type: General  Patient location: PACU  Post pain: Pain level controlled  Post assessment: Post-op Vital signs reviewed  Last Vitals:  Filed Vitals:   05/28/11 0900  BP: 128/67  Pulse: 74  Temp:   Resp: 12    Post vital signs: Reviewed  Level of consciousness: sedated  Complications: No apparent anesthesia complicationsfj

## 2011-05-28 NOTE — Discharge Instructions (Signed)
DISCHARGE INSTRUCTIONS: HYSTEROSCOPY / ENDOMETRIAL ABLATION The following instructions have been prepared to help you care for yourself upon your return home.  MAY TAKE IBUPROFEN/MOTRIN PRODUCTS AFTER 2:00 pm IF NEEDED FOR CRAMPS  Personal hygiene: Marland Kitchen Use sanitary pads for vaginal drainage, not tampons. . Shower the day after your procedure. . NO tub baths, pools or Jacuzzis for 2-3 weeks. . Wipe front to back after using the bathroom.  Activity and limitations: . Do NOT drive or operate any equipment for 24 hours. The effects of anesthesia are still present and drowsiness may result. . Do NOT rest in bed all day. . Walking is encouraged. . Walk up and down stairs slowly. . You may resume your normal activity in one to two days or as indicated by your physician. Sexual activity: NO intercourse for at least 2 weeks after the procedure, or as indicated by your Doctor.  Diet: Eat a light meal as desired this evening. You may resume your usual diet tomorrow.  Return to Work: You may resume your work activities in one to two days or as indicated by Therapist, sports.  What to expect after your surgery: Expect to have vaginal bleeding/discharge for 2-3 days and spotting for up to 10 days. It is not unusual to have soreness for up to 1-2 weeks. You may have a slight burning sensation when you urinate for the first day. Mild cramps may continue for a couple of days. You may have a regular period in 2-6 weeks.  Call your doctor for any of the following: . Excessive vaginal bleeding or clotting, saturating and changing one pad every hour. . Inability to urinate 6 hours after discharge from hospital. . Pain not relieved by pain medication. . Fever of 100.4 F or greater. . Unusual vaginal discharge or odor.  Post Anesthesia Care Unit [phone]

## 2011-05-28 NOTE — Transfer of Care (Signed)
Immediate Anesthesia Transfer of Care Note  Patient: [name]  Procedure(s) Performed: Procedure(s) (LRB): DILATATION & CURETTAGE/HYSTEROSCOPY WITH RESECTOCOPE () POLYPECTOMY (N/A)  Patient Location: PACU  Anesthesia Type: General  Level of Consciousness: awake, alert  and oriented  Airway & Oxygen Therapy: Patient Spontanous Breathing and Patient connected to nasal cannula oxygen  Post-op Assessment: Report given to PACU RN and Post -op Vital signs reviewed and stable  Post vital signs: Reviewed and stable  Complications: No apparent anesthesia complications

## 2011-05-28 NOTE — Op Note (Signed)
05/28/2011  8:21 AM  PATIENT:  [name]  56 y.o. femalewith worsening menorrhagia which did not respond to a hysteroscopy and D&C.  PRE-OPERATIVE DIAGNOSIS:  menorrhagia  POST-OPERATIVE DIAGNOSIS:  menorrhagia  PROCEDURE:  Procedure(s): ThermaChoice ablation POLYPECTOMY  SURGEON:  Surgeon(s): Fortino Sic, MD  PHYSICIAN ASSISTANTnone:    ANESTHESIA:   general  EBL:     BLOOD ADMINISTERED:none  DRAINS: none   LOCAL MEDICATIONS USED:  NONE  SPECIMEN:  Source of Specimen:  uterus, polyp  DISPOSITION OF SPECIMEN:  PATHOLOGY  COUNTS:  YES  TOURNIQUET:  na  DICTATION: .Dragon Dictation  Details of procedure  The patient was placed on the table in a supine position. General anesthesia was induced. She was then placed in a dorsi lithotomy position The vagina was sterilely prepped and draped in the usual fashion. The bladder was drained with a red Robinson catheter. A single-tooth tenaculum was placed on the cervix and the uterus sounded to 10 cm. Iit was anteverted and anteflexed. A small dilator was placed into the cervix and removed.  Reynolds stone forceps were used to retrieve an endometrial polyp initially noted on ultrasound. This was then handed off to the assistant nurse. The catheter was obtained and the balloon was primed with 15 cc of D5W. Then the fluid was aspirated until a -150 was noted n on the machine. A second syringe was used and was attached and the catheter the catheter was carefully inserted into the uterus and the balloon was filled until the pressure reading was 180 and stabilized at 179-180.  D. Start button was pressed when the temperature reached 87.the treatment cycle was started and a safety mechanism on the machine came on and then shut off at when the pressure was above the desired amount.  Therefore the procedure was restarted.  The catheter was re primed. The temperature reached 87.The pressure was stabilized at a reading of 180. Then the  start button and was pushed and the cycle started again.This time the cycle was completed for 8 minutes.  At this point the there was a cool down period observed and when the machine indicated that it was time to remove the catheter, the fluid from the balloon was removed and the catheter was removed.  The procedure was terminated the patient was awakened and taken to recovery in good condition.  PLAN OF CARE: Discharge to home after PACU  PATIENT DISPOSITION:  PACU - hemodynamically stable.   Delay start of Pharmacological VTE agent (>24hrs) due to surgical blood loss or risk of bleeding:  {YES/NO/NOT APPLICABLE:20182

## 2011-05-29 ENCOUNTER — Encounter (HOSPITAL_COMMUNITY): Payer: Self-pay | Admitting: Obstetrics and Gynecology

## 2011-06-05 ENCOUNTER — Other Ambulatory Visit: Payer: Self-pay | Admitting: Family Medicine

## 2011-06-05 ENCOUNTER — Ambulatory Visit
Admission: RE | Admit: 2011-06-05 | Discharge: 2011-06-05 | Disposition: A | Discharge status: Home / Self Care | Payer: BC Managed Care – PPO | Source: Ambulatory Visit | Attending: Family Medicine | Admitting: Family Medicine

## 2011-06-21 ENCOUNTER — Other Ambulatory Visit: Payer: Self-pay | Admitting: Obstetrics and Gynecology

## 2011-06-21 DIAGNOSIS — Z1231 Encounter for screening mammogram for malignant neoplasm of breast: Secondary | ICD-10-CM

## 2011-07-12 ENCOUNTER — Other Ambulatory Visit (HOSPITAL_COMMUNITY)
Admission: RE | Admit: 2011-07-12 | Discharge: 2011-07-12 | Disposition: A | Discharge status: Home / Self Care | Payer: BC Managed Care – PPO | Source: Ambulatory Visit | Attending: Obstetrics and Gynecology | Admitting: Obstetrics and Gynecology

## 2011-07-12 ENCOUNTER — Other Ambulatory Visit: Payer: Self-pay | Admitting: Obstetrics and Gynecology

## 2011-07-12 DIAGNOSIS — Z124 Encounter for screening for malignant neoplasm of cervix: Secondary | ICD-10-CM | POA: Insufficient documentation

## 2011-07-17 ENCOUNTER — Ambulatory Visit (HOSPITAL_COMMUNITY)
Admission: RE | Admit: 2011-07-17 | Discharge: 2011-07-17 | Disposition: A | Discharge status: Home / Self Care | Payer: BC Managed Care – PPO | Source: Ambulatory Visit | Attending: Obstetrics and Gynecology | Admitting: Obstetrics and Gynecology

## 2011-07-17 DIAGNOSIS — Z1231 Encounter for screening mammogram for malignant neoplasm of breast: Secondary | ICD-10-CM | POA: Insufficient documentation

## 2011-07-23 ENCOUNTER — Other Ambulatory Visit: Payer: Self-pay | Admitting: Obstetrics and Gynecology

## 2011-07-23 DIAGNOSIS — R928 Other abnormal and inconclusive findings on diagnostic imaging of breast: Secondary | ICD-10-CM

## 2011-07-27 ENCOUNTER — Ambulatory Visit
Admission: RE | Admit: 2011-07-27 | Discharge: 2011-07-27 | Disposition: A | Discharge status: Home / Self Care | Payer: BC Managed Care – PPO | Source: Ambulatory Visit | Attending: Obstetrics and Gynecology | Admitting: Obstetrics and Gynecology

## 2011-07-27 DIAGNOSIS — R928 Other abnormal and inconclusive findings on diagnostic imaging of breast: Secondary | ICD-10-CM

## 2012-08-04 ENCOUNTER — Other Ambulatory Visit: Payer: Self-pay | Admitting: Obstetrics and Gynecology

## 2012-08-04 DIAGNOSIS — Z1231 Encounter for screening mammogram for malignant neoplasm of breast: Secondary | ICD-10-CM

## 2012-08-06 ENCOUNTER — Ambulatory Visit (HOSPITAL_COMMUNITY)
Admission: RE | Admit: 2012-08-06 | Discharge: 2012-08-06 | Disposition: A | Discharge status: Home / Self Care | Payer: BC Managed Care – PPO | Source: Ambulatory Visit | Attending: Obstetrics and Gynecology | Admitting: Obstetrics and Gynecology

## 2012-08-06 DIAGNOSIS — Z1231 Encounter for screening mammogram for malignant neoplasm of breast: Secondary | ICD-10-CM | POA: Insufficient documentation

## 2012-08-07 ENCOUNTER — Other Ambulatory Visit: Payer: Self-pay | Admitting: Obstetrics and Gynecology

## 2012-08-07 DIAGNOSIS — R928 Other abnormal and inconclusive findings on diagnostic imaging of breast: Secondary | ICD-10-CM

## 2012-08-20 ENCOUNTER — Other Ambulatory Visit: Payer: BC Managed Care – PPO

## 2012-08-27 ENCOUNTER — Ambulatory Visit
Admission: RE | Admit: 2012-08-27 | Discharge: 2012-08-27 | Disposition: A | Discharge status: Home / Self Care | Payer: BC Managed Care – PPO | Source: Ambulatory Visit | Attending: Obstetrics and Gynecology | Admitting: Obstetrics and Gynecology

## 2012-08-27 ENCOUNTER — Other Ambulatory Visit: Payer: Self-pay | Admitting: Obstetrics and Gynecology

## 2012-08-27 DIAGNOSIS — R928 Other abnormal and inconclusive findings on diagnostic imaging of breast: Secondary | ICD-10-CM

## 2012-09-08 ENCOUNTER — Ambulatory Visit
Admission: RE | Admit: 2012-09-08 | Discharge: 2012-09-08 | Disposition: A | Discharge status: Home / Self Care | Payer: BC Managed Care – PPO | Source: Ambulatory Visit | Attending: Obstetrics and Gynecology | Admitting: Obstetrics and Gynecology

## 2012-09-08 DIAGNOSIS — R928 Other abnormal and inconclusive findings on diagnostic imaging of breast: Secondary | ICD-10-CM

## 2013-02-12 HISTORY — PX: SHOULDER ARTHROSCOPY: SHX128

## 2013-02-25 ENCOUNTER — Encounter (HOSPITAL_COMMUNITY): Payer: Self-pay | Admitting: Pharmacist

## 2013-02-25 ENCOUNTER — Other Ambulatory Visit: Payer: Self-pay | Admitting: Obstetrics and Gynecology

## 2013-03-09 ENCOUNTER — Encounter (HOSPITAL_COMMUNITY): Payer: Self-pay

## 2013-03-09 ENCOUNTER — Encounter (HOSPITAL_COMMUNITY)
Admission: RE | Admit: 2013-03-09 | Discharge: 2013-03-09 | Disposition: A | Discharge status: Home / Self Care | Payer: BC Managed Care – PPO | Source: Ambulatory Visit | Attending: Obstetrics and Gynecology | Admitting: Obstetrics and Gynecology

## 2013-03-09 ENCOUNTER — Encounter (INDEPENDENT_AMBULATORY_CARE_PROVIDER_SITE_OTHER): Payer: Self-pay

## 2013-03-09 HISTORY — DX: Hyperlipidemia, unspecified: E78.5

## 2013-03-09 HISTORY — DX: Sleep apnea, unspecified: G47.30

## 2013-03-09 LAB — CBC
HCT: 38.1 % (ref 36.0–46.0)
Hemoglobin: 12.4 g/dL (ref 12.0–15.0)
MCH: 25.7 pg — ABNORMAL LOW (ref 26.0–34.0)
MCHC: 32.5 g/dL (ref 30.0–36.0)
MCV: 79 fL (ref 78.0–100.0)
Platelets: 288 10*3/uL (ref 150–400)
RBC: 4.82 MIL/uL (ref 3.87–5.11)
RDW: 14.9 % (ref 11.5–15.5)
WBC: 7.7 10*3/uL (ref 4.0–10.5)

## 2013-03-09 LAB — BASIC METABOLIC PANEL
BUN: 13 mg/dL (ref 6–23)
CO2: 29 mEq/L (ref 19–32)
Calcium: 9.9 mg/dL (ref 8.4–10.5)
Chloride: 101 mEq/L (ref 96–112)
Creatinine, Ser: 1.01 mg/dL (ref 0.50–1.10)
GFR calc Af Amer: 70 mL/min — ABNORMAL LOW (ref >90)
GFR calc non Af Amer: 61 mL/min — ABNORMAL LOW (ref >90)
Glucose, Bld: 63 mg/dL — ABNORMAL LOW (ref 70–99)
Potassium: 4.1 mEq/L (ref 3.7–5.3)
Sodium: 139 mEq/L (ref 137–147)

## 2013-03-09 NOTE — Pre-Procedure Instructions (Signed)
Dr Lyndle Herrlich informed patient's glucose was 63 at PAT appt. Today.  No orders given.

## 2013-03-09 NOTE — Patient Instructions (Addendum)
   Your procedure is scheduled on: Wednesday, Jan 28  Enter through the [employer] [facility] at: [place] up the phone at the desk and dial [phone] and inform us of your arrival.  Please call this number if you have any problems the morning of surgery: [phone]  Remember: Do not eat food after midnight: Tuesday Do not drink clear liquids after: 9 AM Wednesday, day of surgery Take these medicines the morning of surgery with a SIP OF WATER:  Atorvastatin  Do not wear jewelry, make-up, or FINGER nail polish No metal in your hair or on your body. Do not wear lotions, powders, perfumes.  You may wear deodorant.  Do not bring valuables to the hospital. Contacts, dentures or bridgework may not be worn into surgery.  Leave suitcase in the car. After Surgery it may be brought to your room. For patients being admitted to the hospital, checkout time is 11:00am the day of discharge.  Home with husband Iona Beard cell [phone].

## 2013-03-11 ENCOUNTER — Ambulatory Visit (HOSPITAL_COMMUNITY)
Admission: RE | Admit: 2013-03-11 | Discharge: 2013-03-12 | Disposition: A | Discharge status: Home / Self Care | Payer: BC Managed Care – PPO | Source: Ambulatory Visit | Attending: Obstetrics and Gynecology | Admitting: Obstetrics and Gynecology

## 2013-03-11 ENCOUNTER — Encounter (HOSPITAL_COMMUNITY): Payer: BC Managed Care – PPO | Admitting: Anesthesiology

## 2013-03-11 ENCOUNTER — Encounter (HOSPITAL_COMMUNITY): Payer: Self-pay | Admitting: Anesthesiology

## 2013-03-11 ENCOUNTER — Ambulatory Visit (HOSPITAL_COMMUNITY): Payer: BC Managed Care – PPO | Admitting: Anesthesiology

## 2013-03-11 ENCOUNTER — Encounter (HOSPITAL_COMMUNITY)
Admission: RE | Disposition: A | Discharge status: Home / Self Care | Payer: Self-pay | Source: Ambulatory Visit | Attending: Obstetrics and Gynecology

## 2013-03-11 DIAGNOSIS — N8000 Endometriosis of the uterus, unspecified: Secondary | ICD-10-CM | POA: Insufficient documentation

## 2013-03-11 DIAGNOSIS — N83209 Unspecified ovarian cyst, unspecified side: Secondary | ICD-10-CM | POA: Insufficient documentation

## 2013-03-11 DIAGNOSIS — N72 Inflammatory disease of cervix uteri: Secondary | ICD-10-CM | POA: Insufficient documentation

## 2013-03-11 DIAGNOSIS — N8500 Endometrial hyperplasia, unspecified: Secondary | ICD-10-CM | POA: Insufficient documentation

## 2013-03-11 DIAGNOSIS — G4730 Sleep apnea, unspecified: Secondary | ICD-10-CM | POA: Insufficient documentation

## 2013-03-11 DIAGNOSIS — D251 Intramural leiomyoma of uterus: Secondary | ICD-10-CM | POA: Insufficient documentation

## 2013-03-11 DIAGNOSIS — D252 Subserosal leiomyoma of uterus: Secondary | ICD-10-CM | POA: Insufficient documentation

## 2013-03-11 DIAGNOSIS — Z90710 Acquired absence of both cervix and uterus: Secondary | ICD-10-CM | POA: Diagnosis present

## 2013-03-11 DIAGNOSIS — E785 Hyperlipidemia, unspecified: Secondary | ICD-10-CM | POA: Insufficient documentation

## 2013-03-11 DIAGNOSIS — N950 Postmenopausal bleeding: Secondary | ICD-10-CM | POA: Insufficient documentation

## 2013-03-11 HISTORY — PX: ROBOTIC ASSISTED TOTAL HYSTERECTOMY: SHX6085

## 2013-03-11 SURGERY — ROBOTIC ASSISTED TOTAL HYSTERECTOMY
Anesthesia: General | Site: Abdomen | Laterality: Bilateral

## 2013-03-11 MED ORDER — GLYCOPYRROLATE 0.2 MG/ML IJ SOLN
INTRAMUSCULAR | Status: AC
Start: 2013-03-11 — End: 2013-03-11
  Filled 2013-03-11: qty 1

## 2013-03-11 MED ORDER — ARTIFICIAL TEARS OP OINT
TOPICAL_OINTMENT | OPHTHALMIC | Status: DC | PRN
  Administered 2013-03-11: 1 via OPHTHALMIC

## 2013-03-11 MED ORDER — BUPIVACAINE HCL (PF) 0.25 % IJ SOLN
INTRAMUSCULAR | Status: AC
  Filled 2013-03-11: qty 30

## 2013-03-11 MED ORDER — FENTANYL CITRATE 0.05 MG/ML IJ SOLN
INTRAMUSCULAR | Status: DC | PRN
  Administered 2013-03-11 (×7): 50 ug via INTRAVENOUS

## 2013-03-11 MED ORDER — DEXAMETHASONE SODIUM PHOSPHATE 10 MG/ML IJ SOLN
INTRAMUSCULAR | Status: AC
  Filled 2013-03-11: qty 1

## 2013-03-11 MED ORDER — GLYCOPYRROLATE 0.2 MG/ML IJ SOLN
INTRAMUSCULAR | Status: DC | PRN
  Administered 2013-03-11: 0.6 mg via INTRAVENOUS

## 2013-03-11 MED ORDER — PROPOFOL 10 MG/ML IV BOLUS
INTRAVENOUS | Status: DC | PRN
  Administered 2013-03-11: 200 mg via INTRAVENOUS

## 2013-03-11 MED ORDER — KETOROLAC TROMETHAMINE 30 MG/ML IJ SOLN
INTRAMUSCULAR | Status: DC | PRN
  Administered 2013-03-11: 30 mg via INTRAVENOUS

## 2013-03-11 MED ORDER — KETOROLAC TROMETHAMINE 30 MG/ML IJ SOLN: 30.0000 mg | Freq: Four times a day (QID) | INTRAMUSCULAR | Status: DC

## 2013-03-11 MED ORDER — OXYCODONE-ACETAMINOPHEN 5-325 MG PO TABS
1.0000 | ORAL_TABLET | ORAL | Status: DC | PRN
  Administered 2013-03-12: 2 via ORAL
  Filled 2013-03-11: qty 2

## 2013-03-11 MED ORDER — FENTANYL CITRATE 0.05 MG/ML IJ SOLN
INTRAMUSCULAR | Status: AC
  Filled 2013-03-11: qty 5

## 2013-03-11 MED ORDER — ONDANSETRON HCL 4 MG/2ML IJ SOLN
4.0000 mg | Freq: Four times a day (QID) | INTRAMUSCULAR | Status: DC | PRN
  Filled 2013-03-11: qty 2

## 2013-03-11 MED ORDER — ROCURONIUM BROMIDE 100 MG/10ML IV SOLN
INTRAVENOUS | Status: AC
  Filled 2013-03-11: qty 1

## 2013-03-11 MED ORDER — PANTOPRAZOLE SODIUM 40 MG PO TBEC
40.0000 mg | DELAYED_RELEASE_TABLET | Freq: Every day | ORAL | Status: DC
Start: 2013-03-12 — End: 2013-03-12
  Filled 2013-03-11: qty 1

## 2013-03-11 MED ORDER — LIDOCAINE HCL (CARDIAC) 20 MG/ML IV SOLN
INTRAVENOUS | Status: DC | PRN
  Administered 2013-03-11: 80 mg via INTRAVENOUS

## 2013-03-11 MED ORDER — KETOROLAC TROMETHAMINE 30 MG/ML IJ SOLN
30.0000 mg | Freq: Four times a day (QID) | INTRAMUSCULAR | Status: DC
  Administered 2013-03-11 – 2013-03-12 (×2): 30 mg via INTRAVENOUS
  Filled 2013-03-11 (×2): qty 1

## 2013-03-11 MED ORDER — KETOROLAC TROMETHAMINE 30 MG/ML IJ SOLN
INTRAMUSCULAR | Status: AC
  Filled 2013-03-11: qty 1

## 2013-03-11 MED ORDER — LIDOCAINE HCL (CARDIAC) 20 MG/ML IV SOLN
INTRAVENOUS | Status: AC
  Filled 2013-03-11: qty 5

## 2013-03-11 MED ORDER — ACETAMINOPHEN 10 MG/ML IV SOLN
1000.0000 mg | Freq: Once | INTRAVENOUS | Status: AC
  Administered 2013-03-11: 1000 mg via INTRAVENOUS
  Filled 2013-03-11 (×2): qty 100

## 2013-03-11 MED ORDER — NEOSTIGMINE METHYLSULFATE 1 MG/ML IJ SOLN
INTRAMUSCULAR | Status: DC | PRN
  Administered 2013-03-11: 3 mg via INTRAVENOUS

## 2013-03-11 MED ORDER — DEXAMETHASONE SODIUM PHOSPHATE 10 MG/ML IJ SOLN
INTRAMUSCULAR | Status: DC | PRN
  Administered 2013-03-11: 10 mg via INTRAVENOUS

## 2013-03-11 MED ORDER — MIDAZOLAM HCL 2 MG/2ML IJ SOLN
INTRAMUSCULAR | Status: DC | PRN
  Administered 2013-03-11: 2 mg via INTRAVENOUS

## 2013-03-11 MED ORDER — ZOLPIDEM TARTRATE 5 MG PO TABS: 5.0000 mg | ORAL_TABLET | Freq: Every evening | ORAL | Status: DC | PRN

## 2013-03-11 MED ORDER — IBUPROFEN 800 MG PO TABS: 800.0000 mg | ORAL_TABLET | Freq: Three times a day (TID) | ORAL | Status: DC | PRN

## 2013-03-11 MED ORDER — ROCURONIUM BROMIDE 100 MG/10ML IV SOLN
INTRAVENOUS | Status: DC | PRN
  Administered 2013-03-11: 20 mg via INTRAVENOUS
  Administered 2013-03-11: 10 mg via INTRAVENOUS
  Administered 2013-03-11: 50 mg via INTRAVENOUS
  Administered 2013-03-11: 10 mg via INTRAVENOUS

## 2013-03-11 MED ORDER — MENTHOL 3 MG MT LOZG: 1.0000 | LOZENGE | OROMUCOSAL | Status: DC | PRN

## 2013-03-11 MED ORDER — BUPIVACAINE HCL (PF) 0.25 % IJ SOLN
INTRAMUSCULAR | Status: DC | PRN
  Administered 2013-03-11: 10 mL

## 2013-03-11 MED ORDER — FENTANYL CITRATE 0.05 MG/ML IJ SOLN
25.0000 ug | INTRAMUSCULAR | Status: DC | PRN
  Administered 2013-03-11: 50 ug via INTRAVENOUS
  Administered 2013-03-11: 25 ug via INTRAVENOUS

## 2013-03-11 MED ORDER — ONDANSETRON HCL 4 MG PO TABS: 4.0000 mg | ORAL_TABLET | Freq: Four times a day (QID) | ORAL | Status: DC | PRN

## 2013-03-11 MED ORDER — KETOROLAC TROMETHAMINE 30 MG/ML IJ SOLN: 15.0000 mg | Freq: Once | INTRAMUSCULAR | Status: DC | PRN

## 2013-03-11 MED ORDER — FENTANYL CITRATE 0.05 MG/ML IJ SOLN
INTRAMUSCULAR | Status: AC
  Filled 2013-03-11: qty 2

## 2013-03-11 MED ORDER — METOCLOPRAMIDE HCL 5 MG/ML IJ SOLN: 10.0000 mg | Freq: Once | INTRAMUSCULAR | Status: DC | PRN

## 2013-03-11 MED ORDER — ONDANSETRON HCL 4 MG/2ML IJ SOLN
INTRAMUSCULAR | Status: DC | PRN
  Administered 2013-03-11: 4 mg via INTRAVENOUS

## 2013-03-11 MED ORDER — DEXTROSE IN LACTATED RINGERS 5 % IV SOLN
INTRAVENOUS | Status: DC
  Administered 2013-03-11: 22:00:00 via INTRAVENOUS

## 2013-03-11 MED ORDER — HYDROMORPHONE HCL PF 1 MG/ML IJ SOLN
0.2000 mg | INTRAMUSCULAR | Status: DC | PRN
  Administered 2013-03-11: 0.6 mg via INTRAVENOUS
  Filled 2013-03-11: qty 1

## 2013-03-11 MED ORDER — FLUTICASONE PROPIONATE 50 MCG/ACT NA SUSP
2.0000 | Freq: Every day | NASAL | Status: DC
  Filled 2013-03-11: qty 16

## 2013-03-11 MED ORDER — LACTATED RINGERS IR SOLN
Status: DC | PRN
  Administered 2013-03-11: 3000 mL

## 2013-03-11 MED ORDER — MIDAZOLAM HCL 2 MG/2ML IJ SOLN
INTRAMUSCULAR | Status: AC
  Filled 2013-03-11: qty 2

## 2013-03-11 MED ORDER — GLYCOPYRROLATE 0.2 MG/ML IJ SOLN
INTRAMUSCULAR | Status: AC
  Filled 2013-03-11: qty 1

## 2013-03-11 MED ORDER — PROPOFOL 10 MG/ML IV EMUL
INTRAVENOUS | Status: AC
  Filled 2013-03-11: qty 20

## 2013-03-11 MED ORDER — FLUTICASONE FUROATE 27.5 MCG/SPRAY NA SUSP: 2.0000 | Freq: Every day | NASAL | Status: DC

## 2013-03-11 MED ORDER — FENTANYL CITRATE 0.05 MG/ML IJ SOLN
INTRAMUSCULAR | Status: AC
Start: 2013-03-11 — End: 2013-03-11
  Filled 2013-03-11: qty 2

## 2013-03-11 MED ORDER — CEFAZOLIN SODIUM-DEXTROSE 2-3 GM-% IV SOLR
2.0000 g | INTRAVENOUS | Status: AC
  Administered 2013-03-11: 2 g via INTRAVENOUS

## 2013-03-11 MED ORDER — LACTATED RINGERS IV SOLN
INTRAVENOUS | Status: DC
  Administered 2013-03-11 (×3): via INTRAVENOUS

## 2013-03-11 MED ORDER — NEOSTIGMINE METHYLSULFATE 1 MG/ML IJ SOLN
INTRAMUSCULAR | Status: AC
  Filled 2013-03-11: qty 1

## 2013-03-11 MED ORDER — ONDANSETRON HCL 4 MG/2ML IJ SOLN
INTRAMUSCULAR | Status: AC
  Filled 2013-03-11: qty 2

## 2013-03-11 SURGICAL SUPPLY — 63 items
ADH SKN CLS APL DERMABOND .7 (GAUZE/BANDAGES/DRESSINGS) ×2
BAG URINE DRAINAGE (UROLOGICAL SUPPLIES) ×4 IMPLANT
BARRIER ADHS 3X4 INTERCEED (GAUZE/BANDAGES/DRESSINGS) ×4 IMPLANT
BRR ADH 4X3 ABS CNTRL BYND (GAUZE/BANDAGES/DRESSINGS) ×2
CATH FOLEY 3WAY  5CC 16FR (CATHETERS) ×2
CATH FOLEY 3WAY 5CC 16FR (CATHETERS) ×2 IMPLANT
CHLORAPREP W/TINT 26ML (MISCELLANEOUS) ×4 IMPLANT
CLOTH BEACON ORANGE TIMEOUT ST (SAFETY) ×4 IMPLANT
CONT PATH 16OZ SNAP LID 3702 (MISCELLANEOUS) ×4 IMPLANT
COVER MAYO STAND STRL (DRAPES) ×4 IMPLANT
COVER TABLE BACK 60X90 (DRAPES) ×8 IMPLANT
COVER TIP SHEARS 8 DVNC (MISCELLANEOUS) ×2 IMPLANT
COVER TIP SHEARS 8MM DA VINCI (MISCELLANEOUS) ×2
DECANTER SPIKE VIAL GLASS SM (MISCELLANEOUS) ×4 IMPLANT
DERMABOND ADVANCED (GAUZE/BANDAGES/DRESSINGS) ×2
DERMABOND ADVANCED .7 DNX12 (GAUZE/BANDAGES/DRESSINGS) ×2 IMPLANT
DEVICE TROCAR PUNCTURE CLOSURE (ENDOMECHANICALS) ×3 IMPLANT
DRAPE HUG U DISPOSABLE (DRAPE) ×4 IMPLANT
DRAPE LG THREE QUARTER DISP (DRAPES) ×8 IMPLANT
DRAPE WARM FLUID 44X44 (DRAPE) ×4 IMPLANT
ELECT REM PT RETURN 9FT ADLT (ELECTROSURGICAL) ×4
ELECTRODE REM PT RTRN 9FT ADLT (ELECTROSURGICAL) ×2 IMPLANT
EVACUATOR SMOKE 8.L (FILTER) ×4 IMPLANT
GAUZE VASELINE 3X9 (GAUZE/BANDAGES/DRESSINGS) IMPLANT
GLOVE BIOGEL PI IND STRL 7.0 (GLOVE) ×4 IMPLANT
GLOVE BIOGEL PI INDICATOR 7.0 (GLOVE) ×4
GLOVE ECLIPSE 6.5 STRL STRAW (GLOVE) ×4 IMPLANT
GOWN STRL REUS W/TWL LRG LVL3 (GOWN DISPOSABLE) ×24 IMPLANT
KIT ACCESSORY DA VINCI DISP (KITS) ×2
KIT ACCESSORY DVNC DISP (KITS) ×2 IMPLANT
LEGGING LITHOTOMY PAIR STRL (DRAPES) ×4 IMPLANT
NDL INSUFFLATION 14GA 150MM (NEEDLE) ×1 IMPLANT
NEEDLE INSUFFLATION 14GA 150MM (NEEDLE) ×4 IMPLANT
OCCLUDER COLPOPNEUMO (BALLOONS) ×3 IMPLANT
PACK LAVH (CUSTOM PROCEDURE TRAY) ×4 IMPLANT
PAD PREP 24X48 CUFFED NSTRL (MISCELLANEOUS) ×8 IMPLANT
PLUG CATH AND CAP STER (CATHETERS) ×4 IMPLANT
PROTECTOR NERVE ULNAR (MISCELLANEOUS) ×8 IMPLANT
SCISSORS LAP 5X35 DISP (ENDOMECHANICALS) IMPLANT
SET CYSTO W/LG BORE CLAMP LF (SET/KITS/TRAYS/PACK) IMPLANT
SET IRRIG TUBING LAPAROSCOPIC (IRRIGATION / IRRIGATOR) ×4 IMPLANT
SOLUTION ELECTROLUBE (MISCELLANEOUS) ×4 IMPLANT
SUT VIC AB 0 CT1 36 (SUTURE) ×8 IMPLANT
SUT VICRYL 0 UR6 27IN ABS (SUTURE) ×4 IMPLANT
SUT VLOC 180 0 9IN  GS21 (SUTURE) ×4
SUT VLOC 180 0 9IN GS21 (SUTURE) ×3 IMPLANT
SYR 50ML LL SCALE MARK (SYRINGE) ×4 IMPLANT
SYRINGE 10CC LL (SYRINGE) ×4 IMPLANT
TIP RUMI ORANGE 6.7MMX12CM (TIP) IMPLANT
TIP UTERINE 5.1X6CM LAV DISP (MISCELLANEOUS) IMPLANT
TIP UTERINE 6.7X10CM GRN DISP (MISCELLANEOUS) ×3 IMPLANT
TIP UTERINE 6.7X6CM WHT DISP (MISCELLANEOUS) IMPLANT
TIP UTERINE 6.7X8CM BLUE DISP (MISCELLANEOUS) ×3 IMPLANT
TOWEL OR 17X24 6PK STRL BLUE (TOWEL DISPOSABLE) ×12 IMPLANT
TROCAR 12M 150ML BLUNT (TROCAR) IMPLANT
TROCAR DISP BLADELESS 8 DVNC (TROCAR) ×1 IMPLANT
TROCAR DISP BLADELESS 8MM (TROCAR) ×2
TROCAR XCEL 12X100 BLDLESS (ENDOMECHANICALS) ×4 IMPLANT
TROCAR XCEL NON-BLD 5MMX100MML (ENDOMECHANICALS) ×8 IMPLANT
TROCAR Z-THREAD 12X150 (TROCAR) ×4 IMPLANT
TUBING FILTER THERMOFLATOR (ELECTROSURGICAL) ×4 IMPLANT
WARMER LAPAROSCOPE (MISCELLANEOUS) ×4 IMPLANT
WATER STERILE IRR 1000ML POUR (IV SOLUTION) ×12 IMPLANT

## 2013-03-11 NOTE — Anesthesia Postprocedure Evaluation (Signed)
  Anesthesia Post-op Note  Patient: [name]  Procedure(s) Performed: Procedure(s): ROBOTIC ASSISTED TOTAL HYSTERECTOMY WITH BILATERAL SALPINGO-OOPHORECTOMY (Bilateral) Patient is awake and responsive. Pain and nausea are reasonably well controlled. Vital signs are stable and clinically acceptable. Oxygen saturation is clinically acceptable. There are no apparent anesthetic complications at this time. Patient is ready for discharge.

## 2013-03-11 NOTE — Transfer of Care (Signed)
Immediate Anesthesia Transfer of Care Note  Patient: [name]  Procedure(s) Performed: Procedure(s): ROBOTIC ASSISTED TOTAL HYSTERECTOMY WITH BILATERAL SALPINGO-OOPHORECTOMY (Bilateral)  Patient Location: PACU  Anesthesia Type:General  Level of Consciousness: sedated and patient cooperative  Airway & Oxygen Therapy: Patient Spontanous Breathing and Patient connected to nasal cannula oxygen  Post-op Assessment: Report given to PACU RN and Post -op Vital signs reviewed and stable  Post vital signs: Reviewed and stable  Complications: No apparent anesthesia complications

## 2013-03-11 NOTE — Brief Op Note (Signed)
03/11/2013  5:55 PM  PATIENT:  [name]  58 y.o. female  PRE-OPERATIVE DIAGNOSIS:  Postmenopausal Bleeding, Endometrial Hyperplasia  POST-OPERATIVE DIAGNOSIS:  postmenopausal bleeding, endometrial hyperplasia  PROCEDURE:  Procedure(s): ROBOTIC ASSISTED TOTAL HYSTERECTOMY WITH BILATERAL SALPINGO-OOPHORECTOMY (Bilateral)  SURGEON:  Surgeon(s) and Role:    * Marvene Staff, MD - Primary  PHYSICIAN ASSISTANT:   ASSISTANTS: Laury Deep, CNM   ANESTHESIA:   general Findings: irregular shaped uterus, surgical separated tubes, right> left ovary w/ multiple clear blebs. Also noted on posterior peritoneum, nl ureters bilaterally EBL:  Total I/O In: 1700 [I.V.:1700] Out: 150 [Urine:100; Blood:50]  BLOOD ADMINISTERED:none  DRAINS: none   LOCAL MEDICATIONS USED:  MARCAINE     SPECIMEN:  Source of Specimen:  uteus w/ tubes, ovaries  DISPOSITION OF SPECIMEN:  PATHOLOGY  COUNTS:  YES  TOURNIQUET:  * No tourniquets in log *  DICTATION: .Other Dictation: Dictation Number [id number]  PLAN OF CARE: Admit for overnight observation  PATIENT DISPOSITION:  PACU - hemodynamically stable.   Delay start of Pharmacological VTE agent (>24hrs) due to surgical blood loss or risk of bleeding: no

## 2013-03-11 NOTE — Anesthesia Postprocedure Evaluation (Signed)
  Anesthesia Post-op Note  Patient: [name]  Procedure(s) Performed: Procedure(s): ROBOTIC ASSISTED TOTAL HYSTERECTOMY WITH BILATERAL SALPINGO-OOPHORECTOMY (Bilateral)  Patient Location:   Anesthesia Type:General  Level of Consciousness: awake, alert , oriented and patient cooperative  Airway and Oxygen Therapy: Patient Spontanous Breathing and Patient connected to nasal cannula oxygen  Post-op Pain: mild  Post-op Assessment: Patient's Cardiovascular Status Stable, Respiratory Function Stable, Patent Airway, No signs of Nausea or vomiting, Adequate PO intake and Pain level controlled  Post-op Vital Signs: Reviewed and stable  Complications: No apparent anesthesia complications

## 2013-03-11 NOTE — Anesthesia Preprocedure Evaluation (Addendum)
Anesthesia Evaluation  Patient identified by MRN, date of birth, ID band Patient awake    Reviewed: Allergy & Precautions, H&P , NPO status , Patient's Chart, lab work & pertinent test results, reviewed documented beta blocker date and time   History of Anesthesia Complications Negative for: history of anesthetic complications  Airway Mallampati: II TM Distance: >3 FB Neck ROM: full    Dental  (+) Teeth Intact   Pulmonary sleep apnea (inconsistent CPAP use) ,  breath sounds clear to auscultation  Pulmonary exam normal       Cardiovascular Exercise Tolerance: Good Rhythm:regular Rate:Normal  hyperlipidemia   Neuro/Psych negative neurological ROS  negative psych ROS   GI/Hepatic negative GI ROS, Neg liver ROS,   Endo/Other  BMI 36.3  Renal/GU negative Renal ROS  Female GU complaint     Musculoskeletal   Abdominal   Peds  Hematology negative hematology ROS (+)   Anesthesia Other Findings   Reproductive/Obstetrics negative OB ROS                          Anesthesia Physical Anesthesia Plan  ASA: III  Anesthesia Plan: General ETT   Post-op Pain Management:    Induction:   Airway Management Planned:   Additional Equipment:   Intra-op Plan:   Post-operative Plan:   Informed Consent: I have reviewed the patients History and Physical, chart, labs and discussed the procedure including the risks, benefits and alternatives for the proposed anesthesia with the patient or authorized representative who has indicated his/her understanding and acceptance.   Dental Advisory Given  Plan Discussed with: CRNA and Surgeon  Anesthesia Plan Comments:         Anesthesia Quick Evaluation

## 2013-03-11 NOTE — Transfer of Care (Deleted)
Immediate Anesthesia Transfer of Care Note  Patient: [name]  Procedure(s) Performed: Procedure(s): ROBOTIC ASSISTED TOTAL HYSTERECTOMY WITH BILATERAL SALPINGO-OOPHORECTOMY (Bilateral)  Patient Location: PACU  Anesthesia Type:General  Level of Consciousness: sedated, patient cooperative and responds to stimulation  Airway & Oxygen Therapy: Patient connected to nasal cannula oxygen  Post-op Assessment: Report given to PACU RN and Post -op Vital signs reviewed and stable  Post vital signs: Reviewed and stable  Complications: No apparent anesthesia complications

## 2013-03-12 ENCOUNTER — Encounter (HOSPITAL_COMMUNITY): Payer: Self-pay | Admitting: Obstetrics and Gynecology

## 2013-03-12 LAB — BASIC METABOLIC PANEL
BUN: 7 mg/dL (ref 6–23)
CO2: 28 mEq/L (ref 19–32)
Calcium: 9.1 mg/dL (ref 8.4–10.5)
Chloride: 102 mEq/L (ref 96–112)
Creatinine, Ser: 0.84 mg/dL (ref 0.50–1.10)
GFR calc Af Amer: 88 mL/min — ABNORMAL LOW (ref >90)
GFR calc non Af Amer: 76 mL/min — ABNORMAL LOW (ref >90)
Glucose, Bld: 128 mg/dL — ABNORMAL HIGH (ref 70–99)
Potassium: 4.4 mEq/L (ref 3.7–5.3)
Sodium: 140 mEq/L (ref 137–147)

## 2013-03-12 LAB — CBC
HCT: 36.3 % (ref 36.0–46.0)
Hemoglobin: 12.1 g/dL (ref 12.0–15.0)
MCH: 26.1 pg (ref 26.0–34.0)
MCHC: 33.3 g/dL (ref 30.0–36.0)
MCV: 78.2 fL (ref 78.0–100.0)
Platelets: 259 10*3/uL (ref 150–400)
RBC: 4.64 MIL/uL (ref 3.87–5.11)
RDW: 14.7 % (ref 11.5–15.5)
WBC: 12.1 10*3/uL — ABNORMAL HIGH (ref 4.0–10.5)

## 2013-03-12 MED ORDER — IBUPROFEN 800 MG PO TABS: 800.0000 mg | ORAL_TABLET | Freq: Four times a day (QID) | ORAL | Status: DC | PRN

## 2013-03-12 MED ORDER — OXYCODONE-ACETAMINOPHEN 5-325 MG PO TABS: 1.0000 | ORAL_TABLET | ORAL | Status: DC | PRN

## 2013-03-12 NOTE — Progress Notes (Signed)
Subjective: Patient reports tolerating PO, + flatus and no problems voiding.    Objective: I have reviewed patient's vital signs.  vital signs, intake and output and labs. Filed Vitals:   03/12/13 0610  BP: 147/76  Pulse: 89  Temp: 97.5 F (36.4 C)  Resp: 18   I/O last 3 completed shifts: In: 3857.2 [P.O.:220; I.V.:3637.2] Out: 2800 [Urine:2750; Blood:50]    Lab Results  Component Value Date   WBC 12.1* 03/12/2013   HGB 12.1 03/12/2013   HCT 36.3 03/12/2013   MCV 78.2 03/12/2013   PLT 259 03/12/2013   Lab Results  Component Value Date   CREATININE 0.84 03/12/2013    EXAM General: alert, cooperative and no distress Resp: clear to auscultation bilaterally Cardio: regular rate and rhythm, S1, S2 normal, no murmur, click, rub or gallop GI: soft, non-tender; bowel sounds normal; no masses,  no organomegaly and incision: clean, dry and intact Extremities: no edema, redness or tenderness in the calves or thighs Vaginal Bleeding: none Back (-) CVAT Assessment: s/p Procedure(s): ROBOTIC ASSISTED TOTAL HYSTERECTOMY WITH BILATERAL SALPINGO-OOPHORECTOMY: stable, progressing well and tolerating diet  Plan: Encourage ambulation Advance to PO medication Discontinue IV fluids Discharge home  LOS: 1 day    Saidah Kempton A, MD 03/12/2013 8:09 AM    03/12/2013, 8:09 AM

## 2013-03-12 NOTE — Discharge Summary (Signed)
Physician Discharge Summary  Patient ID: [name] MRN: [id number] DOB/AGE: 21-Sep-1955 58 y.o.  Admit date: 03/11/2013 Discharge date: 03/12/2013  Admission Diagnoses: Persistent PMB,  Simple endometrial hyperplasia  Discharge Diagnoses: Persistent PMB, simple endometrial hyperplasia Active Problems:   S/P hysterectomy   Discharged Condition: stable  Hospital Course: Pt was admitted to [facility]. She underwent daVinci robotic total hysterectomy, BSO.  Pt had uncomplicated postoperative course. Path pending  Consults: None  Significant Diagnostic Studies: labs:  CBC    Component Value Date/Time   WBC 12.1* 03/12/2013 0500   RBC 4.64 03/12/2013 0500   HGB 12.1 03/12/2013 0500   HCT 36.3 03/12/2013 0500   PLT 259 03/12/2013 0500   MCV 78.2 03/12/2013 0500   MCH 26.1 03/12/2013 0500   MCHC 33.3 03/12/2013 0500   RDW 14.7 03/12/2013 0500   LYMPHSABS 2.9 04/01/2008 0500   MONOABS 0.5 04/01/2008 0500   EOSABS 0.2 04/01/2008 0500   BASOSABS 0.0 04/01/2008 0500    BMET    Component Value Date/Time   NA 140 03/12/2013 0500   K 4.4 03/12/2013 0500   CL 102 03/12/2013 0500   CO2 28 03/12/2013 0500   GLUCOSE 128* 03/12/2013 0500   BUN 7 03/12/2013 0500   CREATININE 0.84 03/12/2013 0500   CALCIUM 9.1 03/12/2013 0500   GFRNONAA 76* 03/12/2013 0500   GFRAA 88* 03/12/2013 0500       Treatments: surgery: DaVinci robotic total hysterectomy, BSO  Discharge Exam: Blood pressure 147/76, pulse 89, temperature 97.5 F (36.4 C), temperature source Oral, resp. rate 18, height 5\' 2"  (1.575 m), weight 87.544 kg (193 lb), SpO2 99.00%. General appearance: alert, cooperative and no distress Back: no tenderness to percussion or palpation Resp: clear to auscultation bilaterally Cardio: regular rate and rhythm, S1, S2 normal, no murmur, click, rub or gallop GI: soft, non-tender; bowel sounds normal; no masses,  no organomegaly and incisions well approximated . no erythema/induration or exudate Pelvic:  deferred Extremities: no edema, redness or tenderness in the calves or thighs Pad no bleeding  Disposition: 01-Home or Self Care  Discharge Orders   Future Orders Complete By Expires   Diet - low sodium heart healthy  As directed    Discharge instructions  As directed    Comments:     Call if temperature greater than equal to 100.4, nothing per vagina for 4-6 weeks or severe nausea vomiting, increased incisional pain , drainage or redness in the incision site, no straining with bowel movements, showers no bath   Discharge patient  As directed    Increase activity slowly  As directed    May walk up steps  As directed    No wound care  As directed        Medication List         atorvastatin 40 MG tablet  Commonly known as:  LIPITOR  Take 40 mg by mouth daily.     fluticasone 27.5 MCG/SPRAY nasal spray  Commonly known as:  VERAMYST  Place 2 sprays into the nose daily.     ibuprofen 800 MG tablet  Commonly known as:  ADVIL,MOTRIN  Take 1 tablet (800 mg total) by mouth every 6 (six) hours as needed for mild pain. pain     oxyCODONE-acetaminophen 5-325 MG per tablet  Commonly known as:  PERCOCET/ROXICET  Take 1-2 tablets by mouth every 4 (four) hours as needed for severe pain (moderate to severe pain (when tolerating fluids)).     Vitamin D (Ergocalciferol)  50000 UNITS Caps capsule  Commonly known as:  DRISDOL  Take 50,000 Units by mouth every 7 (seven) days.           Follow-up Information   Follow up with Pascha Fogal A, MD In 2 weeks.   Specialty:  Obstetrics and Gynecology   Contact information:   [address] [place] [address] [phone]       Signed: Servando Salina A 03/12/2013, 8:13 AM

## 2013-03-12 NOTE — Progress Notes (Signed)
Teaching complete  Out in wheelchair   

## 2013-03-12 NOTE — Discharge Instructions (Signed)
Call if temperature greater than equal to 100.4, nothing per vagina for 4-6 weeks or severe nausea vomiting, increased incisional pain , drainage or redness in the incision site, no straining with bowel movements, showers no bath °

## 2013-03-13 IMAGING — CR DG ABDOMEN 1V
2 series · 2 of 2 positions shown · non-contrast
Comparison: None.

CLINICAL DATA: Constipation

ABDOMEN - 1 VIEW

[view not recorded (1 of 2)]
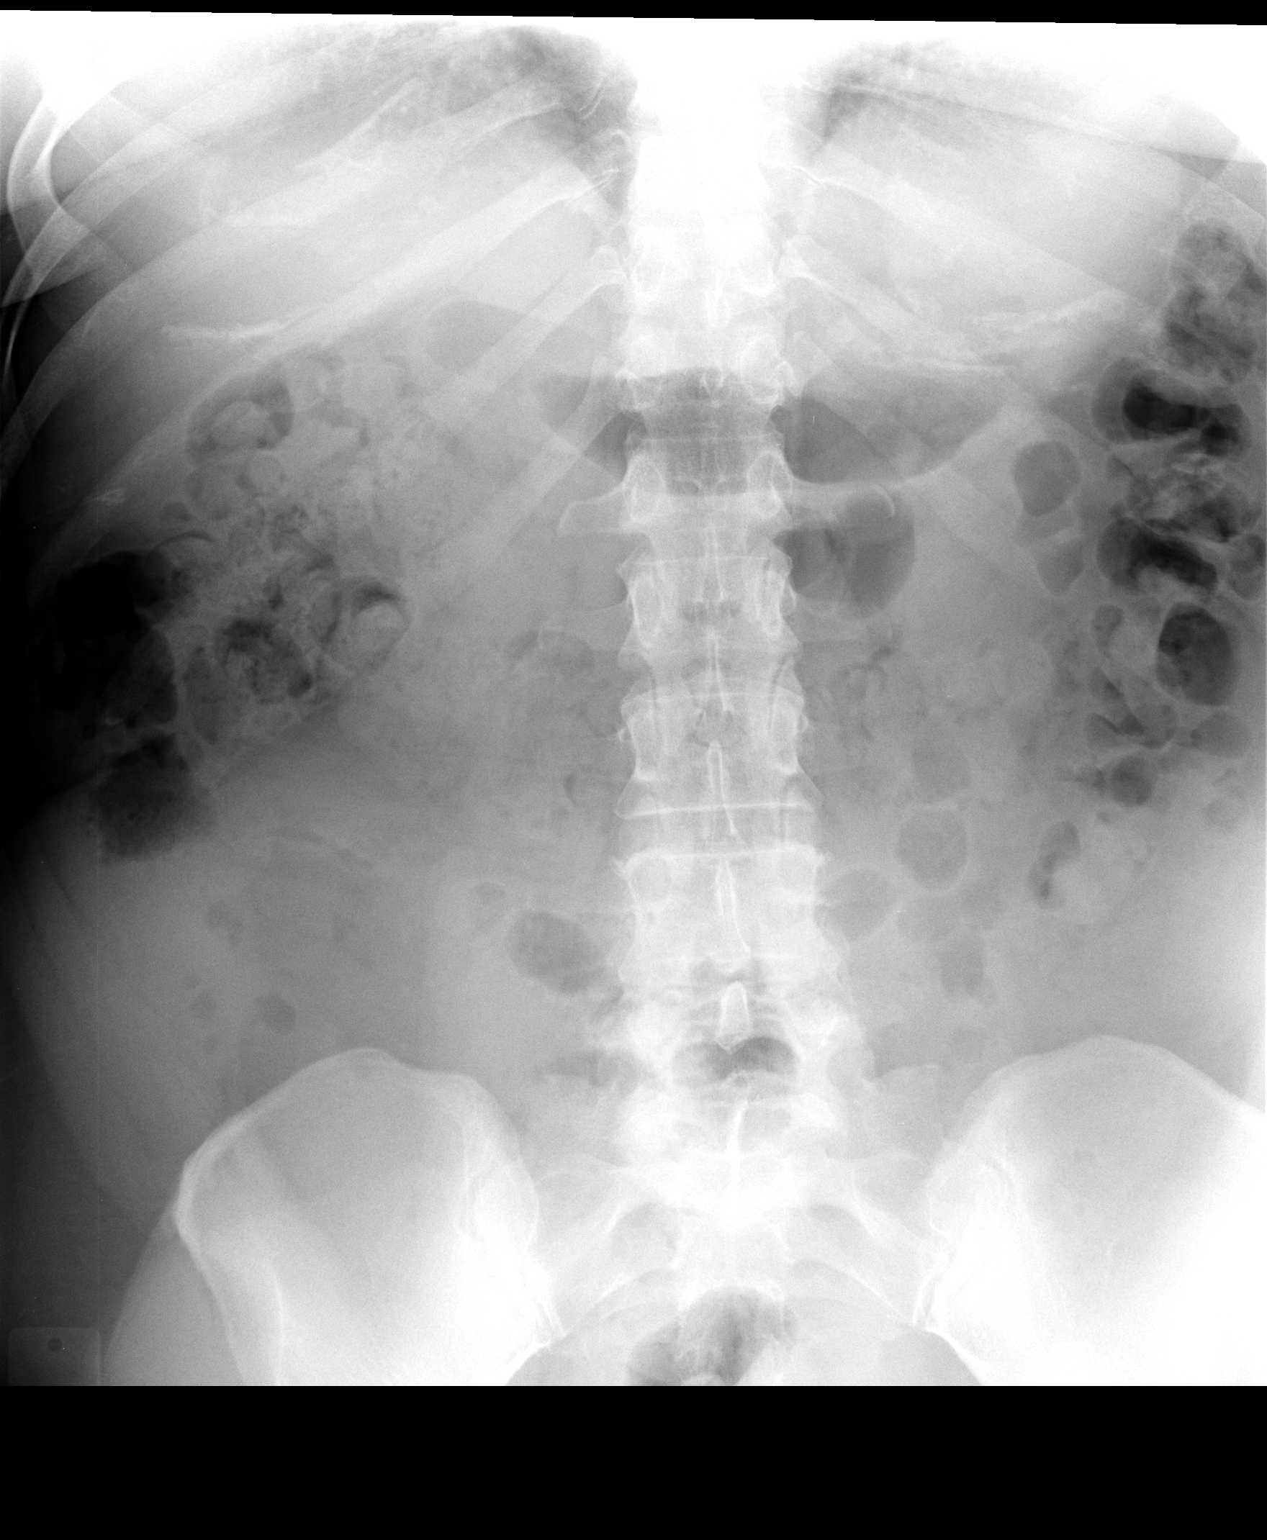

[view not recorded (2 of 2)]
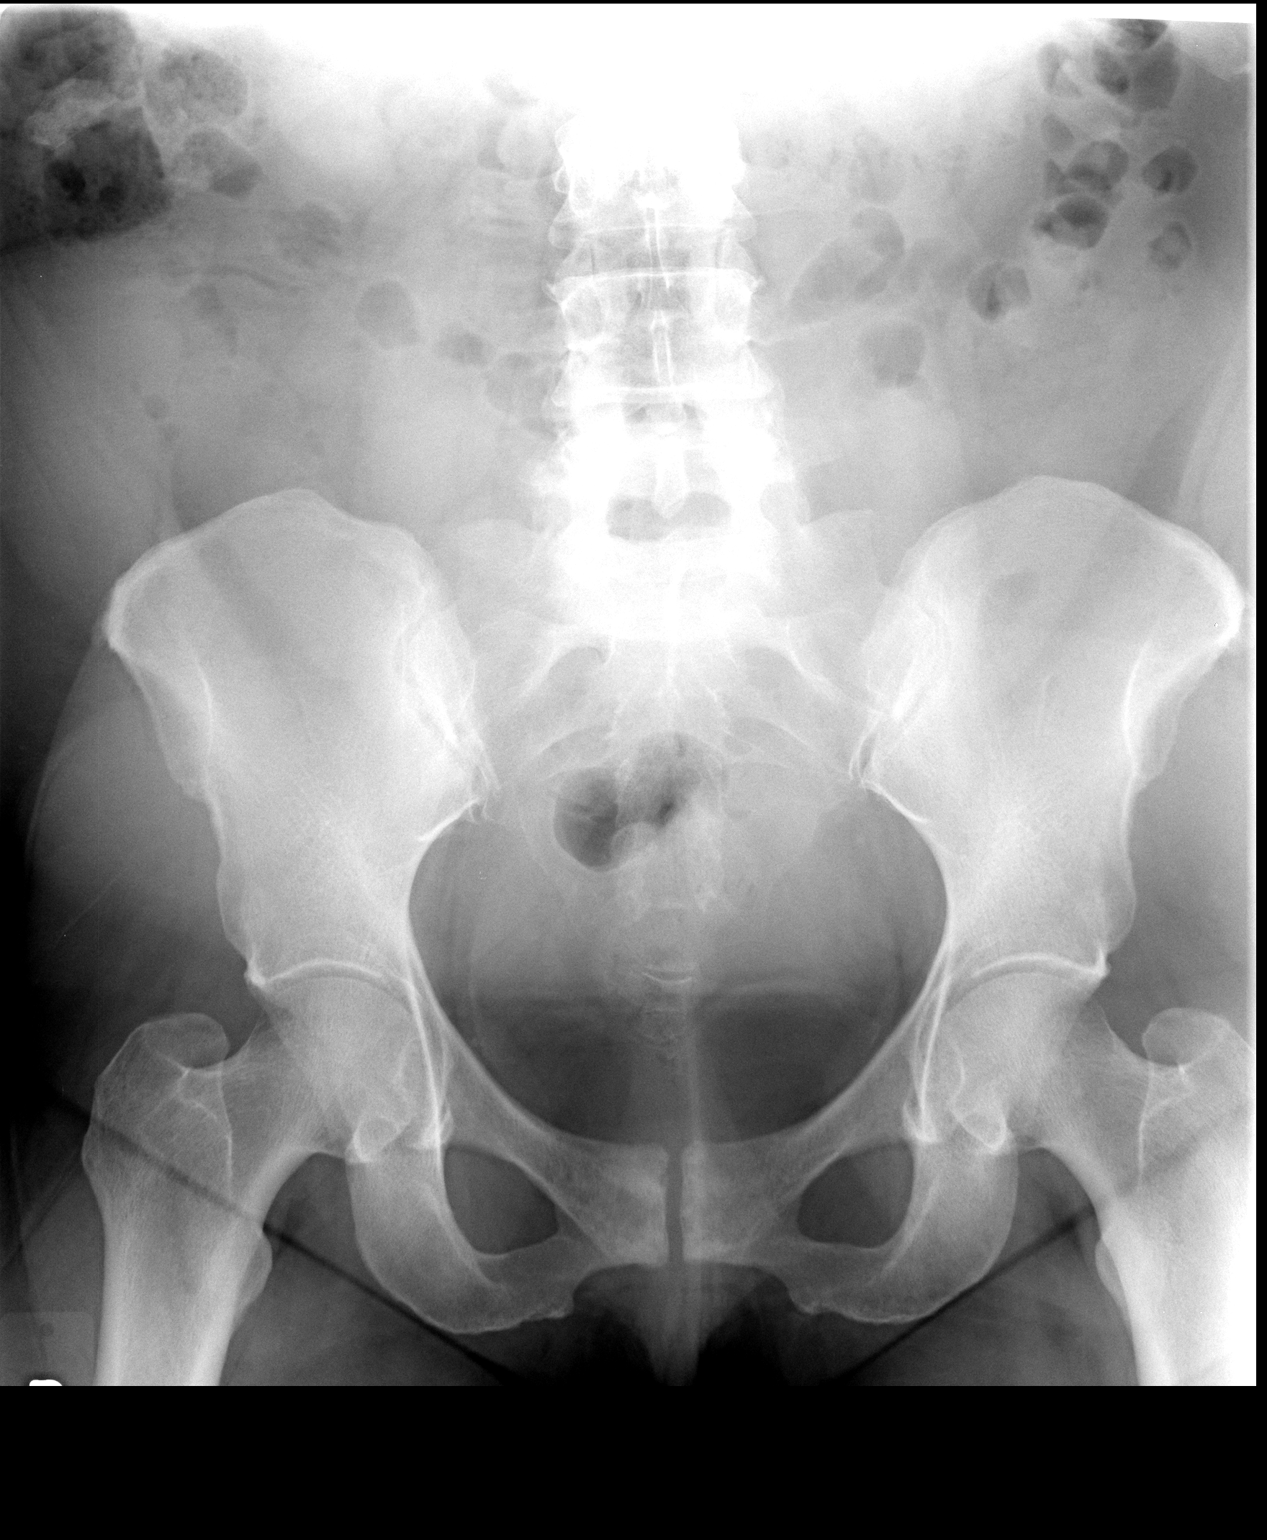

[2 of 2 positions shown; findings below may reference images not displayed]

FINDINGS: Negative for bowel obstruction.  Mild amount of retained
stool in the hepatic flexure and transverse colon.  Small bowel is
not dilated.  No abnormal calcifications.  No bony abnormality.
IMPRESSION: Expected stool in the colon.  Negative for bowel obstruction.

## 2013-03-13 NOTE — Op Note (Signed)
NAMEKARL, KNARR NO.:  [id number]  MEDICAL RECORD NO.:  [id number]  LOCATION:  9309                          FACILITY:  [facility]  PHYSICIAN:  Servando Salina, M.D.DATE OF BIRTH:  [birth year]  DATE OF PROCEDURE:  03/11/2013 DATE OF DISCHARGE:  03/12/2013                              OPERATIVE REPORT   PREOPERATIVE DIAGNOSIS:  Postmenopausal bleeding, simple endometrial hyperplasia.  PROCEDURE:  Da Vinci robotic total hysterectomy, bilateral salpingo- oophorectomy.  POSTOPERATIVE DIAGNOSES:  Postmenopausal bleeding, simple endometrial hyperplasia.  ANESTHESIA:  General.  SURGEON:  Servando Salina, M.D.  ASSISTANT:  Laury Deep, CNM  DESCRIPTION OF PROCEDURE:  Under adequate general anesthesia, the patient was placed in the dorsal lithotomy position for robotic surgery. Examination under anesthesia revealed an enlarged irregular uterus, no adnexal masses could be appreciated.  The patient was sterilely prepped and draped in usual fashion.  Three way indwelling foley catheter was sterilely placed. A weighted speculum was placed in vagina. Anterior lip of the cervix was grasped.  A 0-Vicryl figure-of-eight suture placed on the anterior posterior lip of the cervix.  The cervix was then serially dilated.  The uterus sounded to 9 cm.  The prior radiologic study had suggested that the patient has a septated uterus. The medium size RUMI cup with A #8 uterine manipulator was introduced, however, it did not seat well in terms of the distance from the cervix to the actual end of the proximal part of the RUMI cup.  #10 uterine manipulator was subsequently placed, however, the balloon only insufflated to about 3 mL with good placement at the cervico vaginal junction.  At that point, the retractors were removed from the vagina.  Attention was then turned to the abdomen. 0.25% Marcaine was injected supraumbilically. Supraumbilical incision was then made.  Veress  needle was introduced, tested with normal saline.  Subsequently 3.6 L of CO2 was insufflated.  Veress needle was then removed.  A 12 mm disposable trocar with sleeve was introduced into the abdomen.  At that point, the lighted robotic camera was placed. Entry into abdomen was without incident.  The patient was subsequently placed in Trendelenburg position and the pelvis inspected. There was noted that the uterine manipulator was predominantly on the left side of the uterus with the uterus being a little bit off centered. Nonetheless, the pelvis was inspected and was free of adhesions.  At that point, the additional port sites was placed. Two robotic ports sites in the right and left lower quadrants were done and a 5 mm assistant port was placed in the right upper quadrant. These were done under direct visualization.  The robot was then docked to the patient's left side and PK dissector was placed in the arm #2 and #1, the monopolar scissors were then placed.  I then went to the surgical console.  At the surgical console, the pelvis was further inspected.  It was then noted that there was some multiple fluid-filled blebs on the right ovary, more prominent on the right than on the left and some was also on the posterior peritoneum in the cul-de-sac on the right side of the pelvis.  None was noted anteriorly and no  other lesions were noted. The patient had been consented for possible BSO and decision was then made to proceed as such.  The procedure started on the right side due to the uterus deviating more to the left.  The ureter was actually noted to be seen peristalsing well deep in the pelvis.  The retroperitoneal space was opened.  Partial dissection was done.  The window was opened below that and above the ureter and the IP ligaments were serially clamped, cauterized, and then cut. The round ligament was then clamped, cauterized, and cut.  The anterior and posterior leaf of the  broad ligament was further opened and the anterior vesicouterine peritoneum was then opened transversely and sharply dissected.  Posterior leaf was carried around to the posterior aspect of the uterus.  The uterine vessels were skeletonized which was very prominent. Keeping in view where the ureter was, dissection was continued to drop the posterior leaf of the peritoneum. The uterine vessels were subsequently cauterized and then cut.  Once this was done, opposite side the same procedure was performed with the retroperitoneal space opening on the left.  There was a small adhesion noted with the bowels on the left side which was lysed. The ureter was again also seen peristalsing deep in the pelvis and a window was subsequently made and the medial leaf of the peritoneum above the ureter and IP ligament on the left was serially clamped, cauterized, and then cut.  The dissection of the peritoneum was carried over to the lateral side of the uterus.  The remaining portion of the vesicouterine peritoneum was opened anteriorly, and the bladder was sharply again dissected off the RUMI cup and displaced inferiorly.  Uterine vessels were then noted as isolated, they were tortuous and multiple. Serial clamping of those vessels subsequently resulted in devascularization of the uterus.  The uterine vessels were then cut.  Once all this was done, the proximal portion of the RUMI cup was identified. The cervical vaginal junction was then opened anteriorly and carried around circumferentially and the uterus was then detached from the vagina and removed through the vagina with the tubes and ovaries attached bilaterally. There was some bleeding on the vaginal cuff which was carefully cauterized.  The instruments were then replaced by the long tipped forceps and the suture needle driver. 0 V-Loc suture was then placed with the vaginal cuff closed with a running stitch of 0 V-Loc suture.  Once this was done,  the vaginal cuff was digitally inspected by my  assist, and there was good approximation.  During that process, however, there was noted some bleeding right in the midline.  A 0 Vicryl suture was then inserted and a figure-of-eight suture was then placed right in that area with good hemostasis subsequently noted.  The cavity was then copiously irrigated and suctioned.  The needles were docked on the patient's left side.  The robotic instruments were then removed. The pelvis was inspected with the liver finally being seen with the gallbladder prominent, but otherwise normal.  Once the robotic instruments were removed, I then left from the surgical console, undocked the robot. I went back to the patient's bedside sterilely and removed the needles through the umbilical site with a 8 mm robotic scope.  The pelvis was once again inspected.  Good hemostasis noted. The procedure was then terminated by removing the robotic ports sites under direct visualization.  The abdomen was then deflated.  The supraumbilical site was removed taking care not to bring up  any underlying structure.  The fascia was identified, closed with 0 Vicryl figure-of-eight suture x2.  The skin incisions were then approximated with subcuticular 4-0 Vicryl suture.  The pelvis was again was inspected and good approximation noted at the vaginal cuff with my own exam.  SPECIMEN LABELED:  Uterus, cervix, tubes, and ovaries sent to Pathology.  ESTIMATED BLOOD LOSS:  50 mL.  INTRAOPERATIVE FLUIDS:  800 mL.  URINE OUTPUT:  200 mL clear yellow urine.  The ureters were noted to be peristalsing bilaterally at the end of the case.  The patient was transferred to the recovery room in stable condition.     Servando Salina, M.D.     [place]/[facility]  D:  03/12/2013  T:  03/13/2013  Job:  [id number]

## 2013-03-24 ENCOUNTER — Other Ambulatory Visit: Payer: Self-pay | Admitting: Obstetrics and Gynecology

## 2013-03-24 DIAGNOSIS — N6320 Unspecified lump in the left breast, unspecified quadrant: Secondary | ICD-10-CM

## 2013-04-03 ENCOUNTER — Ambulatory Visit
Admission: RE | Admit: 2013-04-03 | Discharge: 2013-04-03 | Disposition: A | Discharge status: Home / Self Care | Payer: BC Managed Care – PPO | Source: Ambulatory Visit | Attending: Obstetrics and Gynecology | Admitting: Obstetrics and Gynecology

## 2013-04-03 DIAGNOSIS — N6320 Unspecified lump in the left breast, unspecified quadrant: Secondary | ICD-10-CM

## 2013-07-27 ENCOUNTER — Other Ambulatory Visit: Payer: Self-pay

## 2013-07-27 DIAGNOSIS — Z1231 Encounter for screening mammogram for malignant neoplasm of breast: Secondary | ICD-10-CM

## 2013-08-11 ENCOUNTER — Ambulatory Visit
Admission: RE | Admit: 2013-08-11 | Discharge: 2013-08-11 | Disposition: A | Discharge status: Home / Self Care | Payer: BC Managed Care – PPO | Source: Ambulatory Visit

## 2013-08-11 ENCOUNTER — Encounter (INDEPENDENT_AMBULATORY_CARE_PROVIDER_SITE_OTHER): Payer: Self-pay

## 2013-08-11 DIAGNOSIS — Z1231 Encounter for screening mammogram for malignant neoplasm of breast: Secondary | ICD-10-CM

## 2013-08-13 ENCOUNTER — Ambulatory Visit: Payer: BC Managed Care – PPO

## 2014-04-14 ENCOUNTER — Encounter (HOSPITAL_COMMUNITY): Payer: Self-pay

## 2014-04-14 ENCOUNTER — Emergency Department (HOSPITAL_COMMUNITY)
Admission: EM | Admit: 2014-04-14 | Discharge: 2014-04-15 | Disposition: A | Discharge status: Home / Self Care | Payer: BC Managed Care – PPO | Attending: Emergency Medicine | Admitting: Emergency Medicine

## 2014-04-14 DIAGNOSIS — R05 Cough: Secondary | ICD-10-CM

## 2014-04-14 DIAGNOSIS — Z8669 Personal history of other diseases of the nervous system and sense organs: Secondary | ICD-10-CM | POA: Insufficient documentation

## 2014-04-14 DIAGNOSIS — Z7951 Long term (current) use of inhaled steroids: Secondary | ICD-10-CM | POA: Diagnosis not present

## 2014-04-14 DIAGNOSIS — E785 Hyperlipidemia, unspecified: Secondary | ICD-10-CM | POA: Insufficient documentation

## 2014-04-14 DIAGNOSIS — Z862 Personal history of diseases of the blood and blood-forming organs and certain disorders involving the immune mechanism: Secondary | ICD-10-CM | POA: Diagnosis not present

## 2014-04-14 DIAGNOSIS — J9801 Acute bronchospasm: Secondary | ICD-10-CM | POA: Insufficient documentation

## 2014-04-14 DIAGNOSIS — Z791 Long term (current) use of non-steroidal anti-inflammatories (NSAID): Secondary | ICD-10-CM | POA: Insufficient documentation

## 2014-04-14 DIAGNOSIS — Z79899 Other long term (current) drug therapy: Secondary | ICD-10-CM | POA: Insufficient documentation

## 2014-04-14 DIAGNOSIS — R059 Cough, unspecified: Secondary | ICD-10-CM

## 2014-04-14 NOTE — ED Notes (Signed)
Pt has had a dry cough since October, she has been to the doctor and treated with antibiotics, she states the cough is getting worse.

## 2014-04-15 ENCOUNTER — Emergency Department (HOSPITAL_COMMUNITY): Payer: BC Managed Care – PPO

## 2014-04-15 MED ORDER — IPRATROPIUM-ALBUTEROL 0.5-2.5 (3) MG/3ML IN SOLN
3.0000 mL | Freq: Once | RESPIRATORY_TRACT | Status: AC
  Administered 2014-04-15: 3 mL via RESPIRATORY_TRACT
  Filled 2014-04-15: qty 3

## 2014-04-15 MED ORDER — ALBUTEROL SULFATE HFA 108 (90 BASE) MCG/ACT IN AERS: 2.0000 | INHALATION_SPRAY | RESPIRATORY_TRACT | Status: DC | PRN

## 2014-04-15 MED ORDER — PREDNISONE 50 MG PO TABS: ORAL_TABLET | ORAL | Status: DC

## 2014-04-15 MED ORDER — PREDNISONE 20 MG PO TABS
60.0000 mg | ORAL_TABLET | Freq: Once | ORAL | Status: AC
  Administered 2014-04-15: 60 mg via ORAL
  Filled 2014-04-15: qty 3

## 2014-04-15 NOTE — ED Notes (Signed)
Patient transported to X-ray 

## 2014-04-15 NOTE — ED Provider Notes (Signed)
CSN: [id number]     Arrival date & time 04/14/14  2324 History   First MD Initiated Contact with Patient 04/15/14 0159     Chief Complaint  Patient presents with  . Cough     (Consider location/radiation/quality/duration/timing/severity/associated sxs/prior Treatment) Patient is a 59 y.o. female presenting with cough. The history is provided by the patient.  Cough She has had a cough for the last 4 months. Cough is mainly nonproductive but occasion productive of some clear to yellowish sputum. She denies fever, chills, sweats. There is mild dyspnea. There is no nausea or vomiting. She has seen her PCP who had a chest x-ray done which showed no abnormalities. She was given 2 courses of azithromycin with no improvement. On one occasion, she was given a cortisone injection which did give some temporary relief. She has seen a lung specialist to told her that the problem was not in her lungs but in her upper respiratory tract.  Past Medical History  Diagnosis Date  . Anemia   . Seasonal allergies   . Hyperlipidemia   . SVD (spontaneous vaginal delivery)     x 2  . Sleep apnea     Does not use CPAP every night   Past Surgical History  Procedure Laterality Date  . Tonsillectomy    . Svd      x 2  . Tubal ligation    . Dilation and curettage of uterus  04/2011  . Colonoscopy  2008  . Polypectomy  05/28/2011    Procedure: POLYPECTOMY;  Surgeon: Avel Sensor, MD;  Location: [facility];  Service: Gynecology;  Laterality: N/A;  . Robotic assisted total hysterectomy Bilateral 03/11/2013    Procedure: ROBOTIC ASSISTED TOTAL HYSTERECTOMY WITH BILATERAL SALPINGO-OOPHORECTOMY;  Surgeon: Marvene Staff, MD;  Location: [facility];  Service: Gynecology;  Laterality: Bilateral;   History reviewed. No pertinent family history. History  Substance Use Topics  . Smoking status: Never Smoker   . Smokeless tobacco: Never Used  . Alcohol Use: Yes     Comment: wine - weekends   OB History    No data  available     Review of Systems  Respiratory: Positive for cough.   All other systems reviewed and are negative.     Allergies  Review of patient's allergies indicates no known allergies.  Home Medications   Prior to Admission medications   Medication Sig Start Date End Date Taking? Authorizing Provider  atorvastatin (LIPITOR) 40 MG tablet Take 80 mg by mouth daily.    Yes Historical Provider, MD  Chlorpheniramine-DM 2-15 MG/15ML LIQD Take 30 mLs by mouth at bedtime as needed (cough/cold symptoms).   Yes Historical Provider, MD  fluticasone (VERAMYST) 27.5 MCG/SPRAY nasal spray Place 2 sprays into the nose daily.   Yes Historical Provider, MD  omeprazole (PRILOSEC) 40 MG capsule Take 40 mg by mouth daily.   Yes Historical Provider, MD  Vitamin D, Ergocalciferol, (DRISDOL) 50000 UNITS CAPS capsule Take 50,000 Units by mouth every 7 (seven) days.   Yes Historical Provider, MD  ibuprofen (ADVIL,MOTRIN) 800 MG tablet Take 1 tablet (800 mg total) by mouth every 6 (six) hours as needed for mild pain. pain Patient not taking: Reported on 04/15/2014 03/12/13   Marvene Staff, MD  oxyCODONE-acetaminophen (PERCOCET/ROXICET) 5-325 MG per tablet Take 1-2 tablets by mouth every 4 (four) hours as needed for severe pain (moderate to severe pain (when tolerating fluids)). 03/12/13   Sheronette Clint Bolder, MD   BP 195/76 mmHg  Pulse 135  Temp(Src) 98.2 F (36.8 C) (Oral)  Resp 24  SpO2 96% Physical Exam  Nursing note and vitals reviewed.  59 year old female, resting comfortably and in no acute distress. Vital signs are significant for tachypnea and tachycardia and hypertension. Oxygen saturation is 96%, which is normal. Head is normocephalic and atraumatic. PERRLA, EOMI. Oropharynx is clear. There is no edema of the turbinates and no nasal drainage. Neck is nontender and supple without adenopathy or JVD. Back is nontender and there is no CVA tenderness. Lungs are have coarse expiratory wheezes  diffusely. There are no rales or rhonchi. Chest is nontender. Heart has regular rate and rhythm without murmur. Abdomen is soft, flat, nontender without masses or hepatosplenomegaly and peristalsis is normoactive. Extremities have no cyanosis or edema, full range of motion is present. Skin is warm and dry without rash. Neurologic: Mental status is normal, cranial nerves are intact, there are no motor or sensory deficits.  ED Course  Procedures (including critical care time)  Imaging Review Dg Chest 2 View  04/15/2014   CLINICAL DATA:  Cough since October  EXAM: CHEST  2 VIEW  COMPARISON:  04/01/2008  FINDINGS: Normal heart size and mediastinal contours. No acute infiltrate or edema. No effusion or pneumothorax. No acute osseous findings.  IMPRESSION: No active cardiopulmonary disease.   Electronically Signed   By: Monte Fantasia M.D.   On: 04/15/2014 02:58   Images viewed by me.   MDM   Final diagnoses:  Cough  Bronchospasm    Cough with wheezing that has responded as an outpatient to a steroid injection. Old records are reviewed and she has no relevant past visits and I cannot see any of her recent x-rays. Chest x-ray will be repeated and she'll be given a therapeutic trial of albuterol with ipratropium as well as an initial dose of prednisone.  Chest x-ray is unremarkable. Following albuterol with ipratropium, lungs are completely clear and patient is noted that cough has subsided substantially. Repeat vital signs show normal respirations and heart rate. She is discharged with prescription for albuterol inhaler and prednisone and she is to follow-up with her PCP. Given length of time that she has had symptoms, I feel she should be considered for steroid inhaler.  Delora Fuel, MD 06/00/45 9977

## 2014-04-15 NOTE — Discharge Instructions (Signed)
Cough, Adult  A cough is a reflex that helps clear your throat and airways. It can help heal the body or may be a reaction to an irritated airway. A cough may only last 2 or 3 weeks (acute) or may last more than 8 weeks (chronic).  CAUSES Acute cough:  Viral or bacterial infections. Chronic cough:  Infections.  Allergies.  Asthma.  Post-nasal drip.  Smoking.  Heartburn or acid reflux.  Some medicines.  Chronic lung problems (COPD).  Cancer. SYMPTOMS   Cough.  Fever.  Chest pain.  Increased breathing rate.  High-pitched whistling sound when breathing (wheezing).  Colored mucus that you cough up (sputum). TREATMENT   A bacterial cough may be treated with antibiotic medicine.  A viral cough must run its course and will not respond to antibiotics.  Your caregiver may recommend other treatments if you have a chronic cough. HOME CARE INSTRUCTIONS   Only take over-the-counter or prescription medicines for pain, discomfort, or fever as directed by your caregiver. Use cough suppressants only as directed by your caregiver.  Use a cold steam vaporizer or humidifier in your bedroom or home to help loosen secretions.  Sleep in a semi-upright position if your cough is worse at night.  Rest as needed.  Stop smoking if you smoke. SEEK IMMEDIATE MEDICAL CARE IF:   You have pus in your sputum.  Your cough starts to worsen.  You cannot control your cough with suppressants and are losing sleep.  You begin coughing up blood.  You have difficulty breathing.  You develop pain which is getting worse or is uncontrolled with medicine.  You have a fever. MAKE SURE YOU:   Understand these instructions.  Will watch your condition.  Will get help right away if you are not doing well or get worse. Document Released: 07/28/2010 Document Revised: 04/23/2011 Document Reviewed: 07/28/2010 [facility] Patient Information 2015 [place], [place]. This information is not intended  to replace advice given to you by your health care provider. Make sure you discuss any questions you have with your health care provider.   Bronchospasm A bronchospasm is a spasm or tightening of the airways going into the lungs. During a bronchospasm breathing becomes more difficult because the airways get smaller. When this happens there can be coughing, a whistling sound when breathing (wheezing), and difficulty breathing. Bronchospasm is often associated with asthma, but not all patients who experience a bronchospasm have asthma. CAUSES  A bronchospasm is caused by inflammation or irritation of the airways. The inflammation or irritation may be triggered by:   Allergies (such as to animals, pollen, food, or mold). Allergens that cause bronchospasm may cause wheezing immediately after exposure or many hours later.   Infection. Viral infections are believed to be the most common cause of bronchospasm.   Exercise.   Irritants (such as pollution, cigarette smoke, strong odors, aerosol sprays, and paint fumes).   Weather changes. Winds increase molds and pollens in the air. Rain refreshes the air by washing irritants out. Cold air may cause inflammation.   Stress and emotional upset.  SIGNS AND SYMPTOMS   Wheezing.   Excessive nighttime coughing.   Frequent or severe coughing with a simple cold.   Chest tightness.   Shortness of breath.  DIAGNOSIS  Bronchospasm is usually diagnosed through a history and physical exam. Tests, such as chest X-rays, are sometimes done to look for other conditions. TREATMENT   Inhaled medicines can be given to open up your airways and help you breathe.  The medicines can be given using either an inhaler or a nebulizer machine.  Corticosteroid medicines may be given for severe bronchospasm, usually when it is associated with asthma. HOME CARE INSTRUCTIONS   Always have a plan prepared for seeking medical care. Know when to call your health  care provider and local emergency services (911 in the [place] Know where you can access local emergency care.  Only take medicines as directed by your health care provider.  If you were prescribed an inhaler or nebulizer machine, ask your health care provider to explain how to use it correctly. Always use a spacer with your inhaler if you were given one.  It is necessary to remain calm during an attack. Try to relax and breathe more slowly.  Control your home environment in the following ways:   Change your heating and air conditioning filter at least once a month.   Limit your use of fireplaces and wood stoves.  Do not smoke and do not allow smoking in your home.   Avoid exposure to perfumes and fragrances.   Get rid of pests (such as roaches and mice) and their droppings.   Throw away plants if you see mold on them.   Keep your house clean and dust free.   Replace carpet with wood, tile, or vinyl flooring. Carpet can trap dander and dust.   Use allergy-proof pillows, mattress covers, and box spring covers.   Wash bed sheets and blankets every week in hot water and dry them in a dryer.   Use blankets that are made of polyester or cotton.   Wash hands frequently. SEEK MEDICAL CARE IF:   You have muscle aches.   You have chest pain.   The sputum changes from clear or white to yellow, green, gray, or bloody.   The sputum you cough up gets thicker.   There are problems that may be related to the medicine you are given, such as a rash, itching, swelling, or trouble breathing.  SEEK IMMEDIATE MEDICAL CARE IF:   You have worsening wheezing and coughing even after taking your prescribed medicines.   You have increased difficulty breathing.   You develop severe chest pain. MAKE SURE YOU:   Understand these instructions.  Will watch your condition.  Will get help right away if you are not doing well or get worse. Document Released: 02/01/2003  Document Revised: 02/03/2013 Document Reviewed: 07/21/2012 [facility] Patient Information 2015 [place], [place]. This information is not intended to replace advice given to you by your health care provider. Make sure you discuss any questions you have with your health care provider.  Albuterol inhalation aerosol What is this medicine? ALBUTEROL (al Normajean [place]) is a bronchodilator. It helps open up the airways in your lungs to make it easier to breathe. This medicine is used to treat and to prevent bronchospasm. This medicine may be used for other purposes; ask your health care provider or pharmacist if you have questions. COMMON BRAND NAME(S): Proair HFA, Proventil, Proventil HFA, Respirol, Ventolin, Ventolin HFA What should I tell my health care provider before I take this medicine? They need to know if you have any of the following conditions: -diabetes -heart disease or irregular heartbeat -high blood pressure -pheochromocytoma -seizures -thyroid disease -an unusual or allergic reaction to albuterol, levalbuterol, sulfites, other medicines, foods, dyes, or preservatives -pregnant or trying to get pregnant -breast-feeding How should I use this medicine? This medicine is for inhalation through the mouth. Follow the directions on your  prescription label. Take your medicine at regular intervals. Do not use more often than directed. Make sure that you are using your inhaler correctly. Ask you doctor or health care provider if you have any questions. Talk to your pediatrician regarding the use of this medicine in children. Special care may be needed. Overdosage: If you think you have taken too much of this medicine contact a poison control center or emergency room at once. NOTE: This medicine is only for you. Do not share this medicine with others. What if I miss a dose? If you miss a dose, use it as soon as you can. If it is almost time for your next dose, use only that dose. Do not use double  or extra doses. What may interact with this medicine? -anti-infectives like chloroquine and pentamidine -caffeine -cisapride -diuretics -medicines for colds -medicines for depression or for emotional or psychotic conditions -medicines for weight loss including some herbal products -methadone -some antibiotics like clarithromycin, erythromycin, levofloxacin, and linezolid -some heart medicines -steroid hormones like dexamethasone, cortisone, hydrocortisone -theophylline -thyroid hormones This list may not describe all possible interactions. Give your health care provider a list of all the medicines, herbs, non-prescription drugs, or dietary supplements you use. Also tell them if you smoke, drink alcohol, or use illegal drugs. Some items may interact with your medicine. What should I watch for while using this medicine? Tell your doctor or health care professional if your symptoms do not improve. Do not use extra albuterol. If your asthma or bronchitis gets worse while you are using this medicine, call your doctor right away. If your mouth gets dry try chewing sugarless gum or sucking hard candy. Drink water as directed. What side effects may I notice from receiving this medicine? Side effects that you should report to your doctor or health care professional as soon as possible: -allergic reactions like skin rash, itching or hives, swelling of the face, lips, or tongue -breathing problems -chest pain -feeling faint or lightheaded, falls -high blood pressure -irregular heartbeat -fever -muscle cramps or weakness -pain, tingling, numbness in the hands or feet -vomiting Side effects that usually do not require medical attention (report to your doctor or health care professional if they continue or are bothersome): -cough -difficulty sleeping -headache -nervousness or trembling -stomach upset -stuffy or runny nose -throat irritation -unusual taste This list may not describe all  possible side effects. Call your doctor for medical advice about side effects. You may report side effects to FDA at 1-800-FDA-1088. Where should I keep my medicine? Keep out of the reach of children. Store at room temperature between 15 and 30 degrees C (59 and 86 degrees F). The contents are under pressure and may burst when exposed to heat or flame. Do not freeze. This medicine does not work as well if it is too cold. Throw away any unused medicine after the expiration date. Inhalers need to be thrown away after the labeled number of puffs have been used or by the expiration date; whichever comes first. Ventolin HFA should be thrown away 12 months after removing from foil pouch. Check the instructions that come with your medicine. NOTE: This sheet is a summary. It may not cover all possible information. If you have questions about this medicine, talk to your doctor, pharmacist, or health care provider.  2015, Elsevier/Gold Standard. (2012-07-17 10:57:17)  Prednisone tablets What is this medicine? PREDNISONE (PRED ni sone) is a corticosteroid. It is commonly used to treat inflammation of the skin, joints, lungs,  and other organs. Common conditions treated include asthma, allergies, and arthritis. It is also used for other conditions, such as blood disorders and diseases of the adrenal glands. This medicine may be used for other purposes; ask your health care provider or pharmacist if you have questions. COMMON BRAND NAME(S): Deltasone, Predone, Sterapred, Sterapred DS What should I tell my health care provider before I take this medicine? They need to know if you have any of these conditions: -Cushing's syndrome -diabetes -glaucoma -heart disease -high blood pressure -infection (especially a virus infection such as chickenpox, cold sores, or herpes) -kidney disease -liver disease -mental illness -myasthenia gravis -osteoporosis -seizures -stomach or intestine problems -thyroid  disease -an unusual or allergic reaction to lactose, prednisone, other medicines, foods, dyes, or preservatives -pregnant or trying to get pregnant -breast-feeding How should I use this medicine? Take this medicine by mouth with a glass of water. Follow the directions on the prescription label. Take this medicine with food. If you are taking this medicine once a day, take it in the morning. Do not take more medicine than you are told to take. Do not suddenly stop taking your medicine because you may develop a severe reaction. Your doctor will tell you how much medicine to take. If your doctor wants you to stop the medicine, the dose may be slowly lowered over time to avoid any side effects. Talk to your pediatrician regarding the use of this medicine in children. Special care may be needed. Overdosage: If you think you have taken too much of this medicine contact a poison control center or emergency room at once. NOTE: This medicine is only for you. Do not share this medicine with others. What if I miss a dose? If you miss a dose, take it as soon as you can. If it is almost time for your next dose, talk to your doctor or health care professional. You may need to miss a dose or take an extra dose. Do not take double or extra doses without advice. What may interact with this medicine? Do not take this medicine with any of the following medications: -metyrapone -mifepristone This medicine may also interact with the following medications: -aminoglutethimide -amphotericin B -aspirin and aspirin-like medicines -barbiturates -certain medicines for diabetes, like glipizide or glyburide -cholestyramine -cholinesterase inhibitors -cyclosporine -digoxin -diuretics -ephedrine -female hormones, like estrogens and birth control pills -isoniazid -ketoconazole -NSAIDS, medicines for pain and inflammation, like ibuprofen or naproxen -phenytoin -rifampin -toxoids -vaccines -warfarin This list may  not describe all possible interactions. Give your health care provider a list of all the medicines, herbs, non-prescription drugs, or dietary supplements you use. Also tell them if you smoke, drink alcohol, or use illegal drugs. Some items may interact with your medicine. What should I watch for while using this medicine? Visit your doctor or health care professional for regular checks on your progress. If you are taking this medicine over a prolonged period, carry an identification card with your name and address, the type and dose of your medicine, and your doctor's name and address. This medicine may increase your risk of getting an infection. Tell your doctor or health care professional if you are around anyone with measles or chickenpox, or if you develop sores or blisters that do not heal properly. If you are going to have surgery, tell your doctor or health care professional that you have taken this medicine within the last twelve months. Ask your doctor or health care professional about your diet. You may need to  lower the amount of salt you eat. This medicine may affect blood sugar levels. If you have diabetes, check with your doctor or health care professional before you change your diet or the dose of your diabetic medicine. What side effects may I notice from receiving this medicine? Side effects that you should report to your doctor or health care professional as soon as possible: -allergic reactions like skin rash, itching or hives, swelling of the face, lips, or tongue -changes in emotions or moods -changes in vision -depressed mood -eye pain -fever or chills, cough, sore throat, pain or difficulty passing urine -increased thirst -swelling of ankles, feet Side effects that usually do not require medical attention (report to your doctor or health care professional if they continue or are bothersome): -confusion, excitement, restlessness -headache -nausea, vomiting -skin problems,  acne, thin and shiny skin -trouble sleeping -weight gain This list may not describe all possible side effects. Call your doctor for medical advice about side effects. You may report side effects to FDA at 1-800-FDA-1088. Where should I keep my medicine? Keep out of the reach of children. Store at room temperature between 15 and 30 degrees C (59 and 86 degrees F). Protect from light. Keep container tightly closed. Throw away any unused medicine after the expiration date. NOTE: This sheet is a summary. It may not cover all possible information. If you have questions about this medicine, talk to your doctor, pharmacist, or health care provider.  2015, Elsevier/Gold Standard. (2010-09-14 10:57:14)

## 2014-08-11 ENCOUNTER — Other Ambulatory Visit: Payer: Self-pay

## 2014-08-11 DIAGNOSIS — Z1231 Encounter for screening mammogram for malignant neoplasm of breast: Secondary | ICD-10-CM

## 2014-08-19 ENCOUNTER — Ambulatory Visit
Admission: RE | Admit: 2014-08-19 | Discharge: 2014-08-19 | Disposition: A | Discharge status: Home / Self Care | Payer: BC Managed Care – PPO | Source: Ambulatory Visit

## 2014-08-19 DIAGNOSIS — Z1231 Encounter for screening mammogram for malignant neoplasm of breast: Secondary | ICD-10-CM

## 2015-03-28 DIAGNOSIS — M5417 Radiculopathy, lumbosacral region: Secondary | ICD-10-CM | POA: Insufficient documentation

## 2015-04-20 ENCOUNTER — Other Ambulatory Visit: Payer: Self-pay

## 2015-04-20 DIAGNOSIS — Z1231 Encounter for screening mammogram for malignant neoplasm of breast: Secondary | ICD-10-CM

## 2015-08-24 ENCOUNTER — Ambulatory Visit: Payer: BC Managed Care – PPO

## 2015-09-02 ENCOUNTER — Ambulatory Visit: Payer: BC Managed Care – PPO

## 2015-09-13 ENCOUNTER — Ambulatory Visit
Admission: RE | Admit: 2015-09-13 | Discharge: 2015-09-13 | Disposition: A | Discharge status: Home / Self Care | Payer: BC Managed Care – PPO | Source: Ambulatory Visit

## 2015-09-13 DIAGNOSIS — Z1231 Encounter for screening mammogram for malignant neoplasm of breast: Secondary | ICD-10-CM

## 2015-11-16 DIAGNOSIS — M7060 Trochanteric bursitis, unspecified hip: Secondary | ICD-10-CM | POA: Insufficient documentation

## 2016-01-22 IMAGING — CR DG CHEST 2V
2 series · 2 of 2 positions shown · non-contrast
Comparison: 04/01/2008

CLINICAL DATA: Cough since [REDACTED]

EXAM:
CHEST  2 VIEW

[w chest pa]
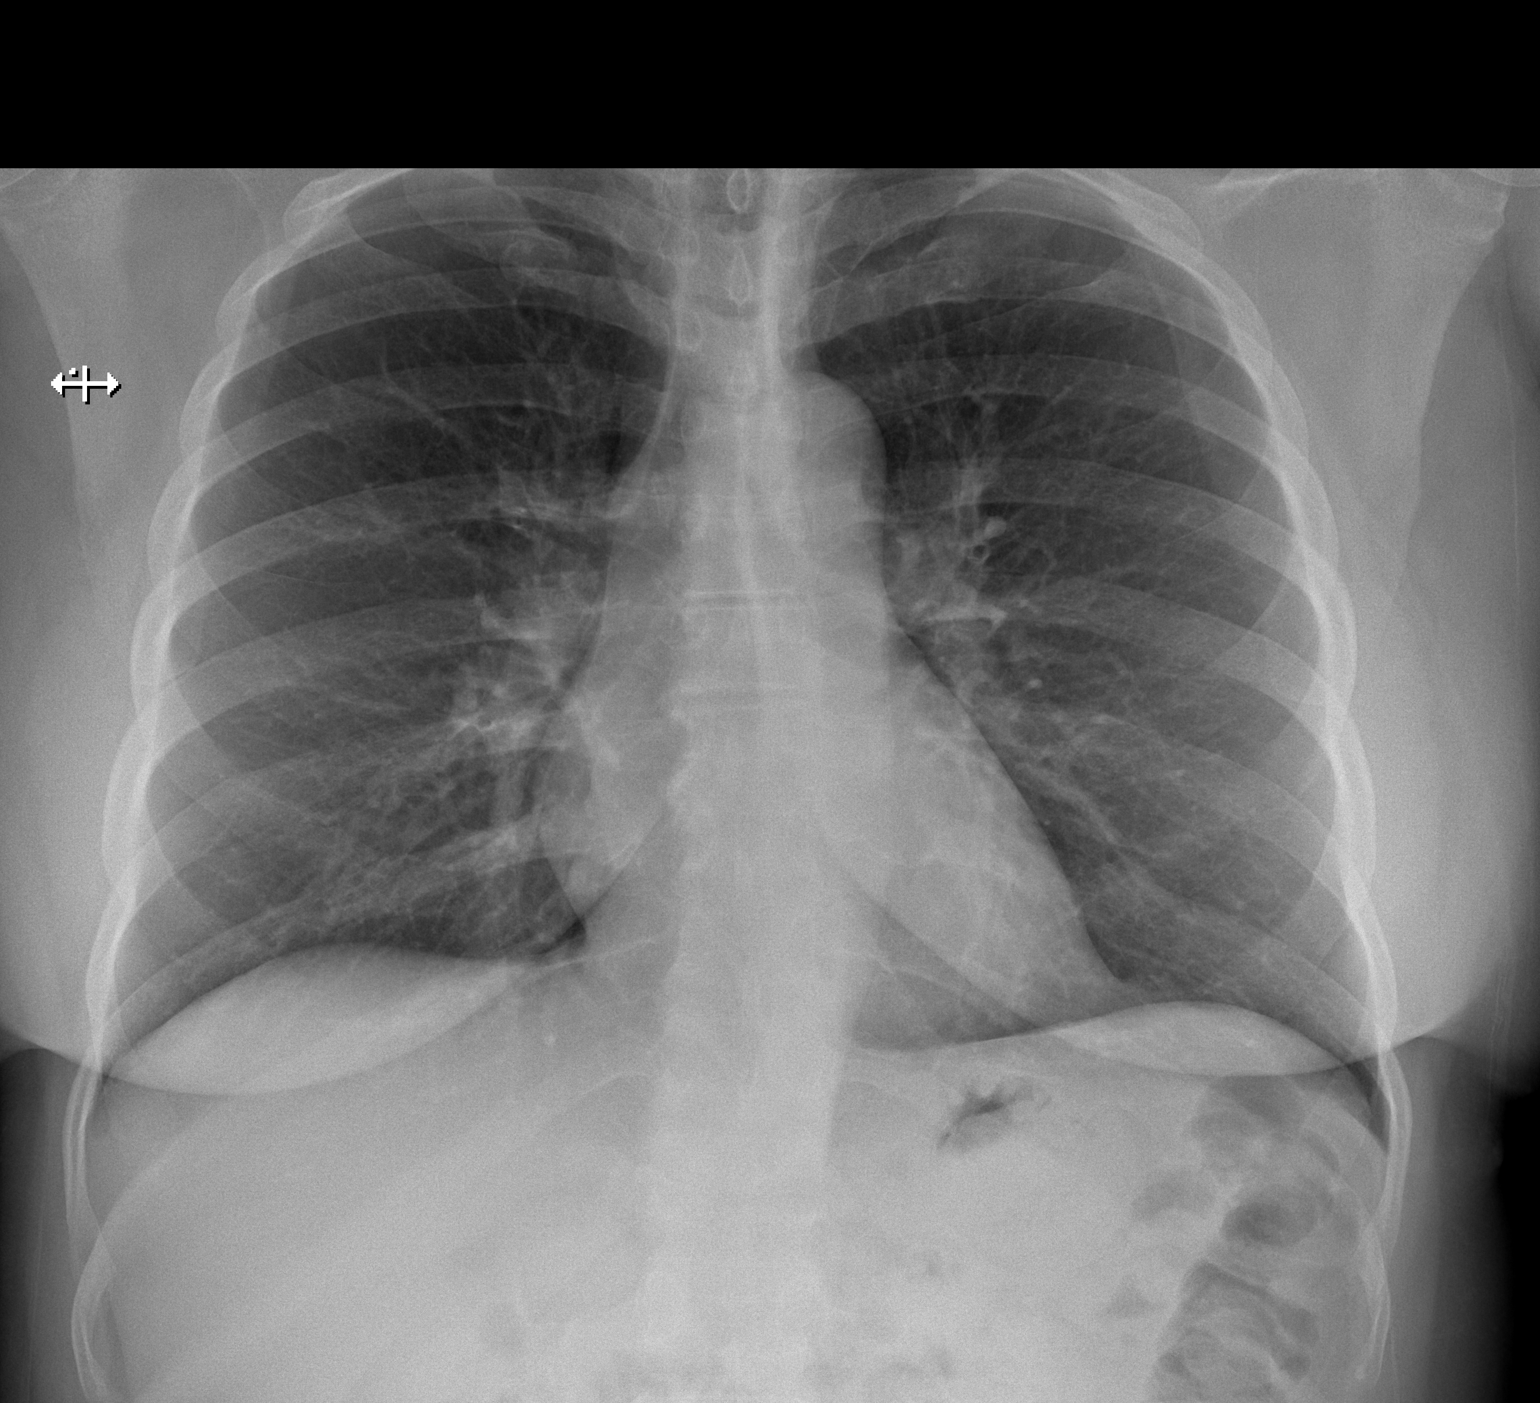

[w chest lat]
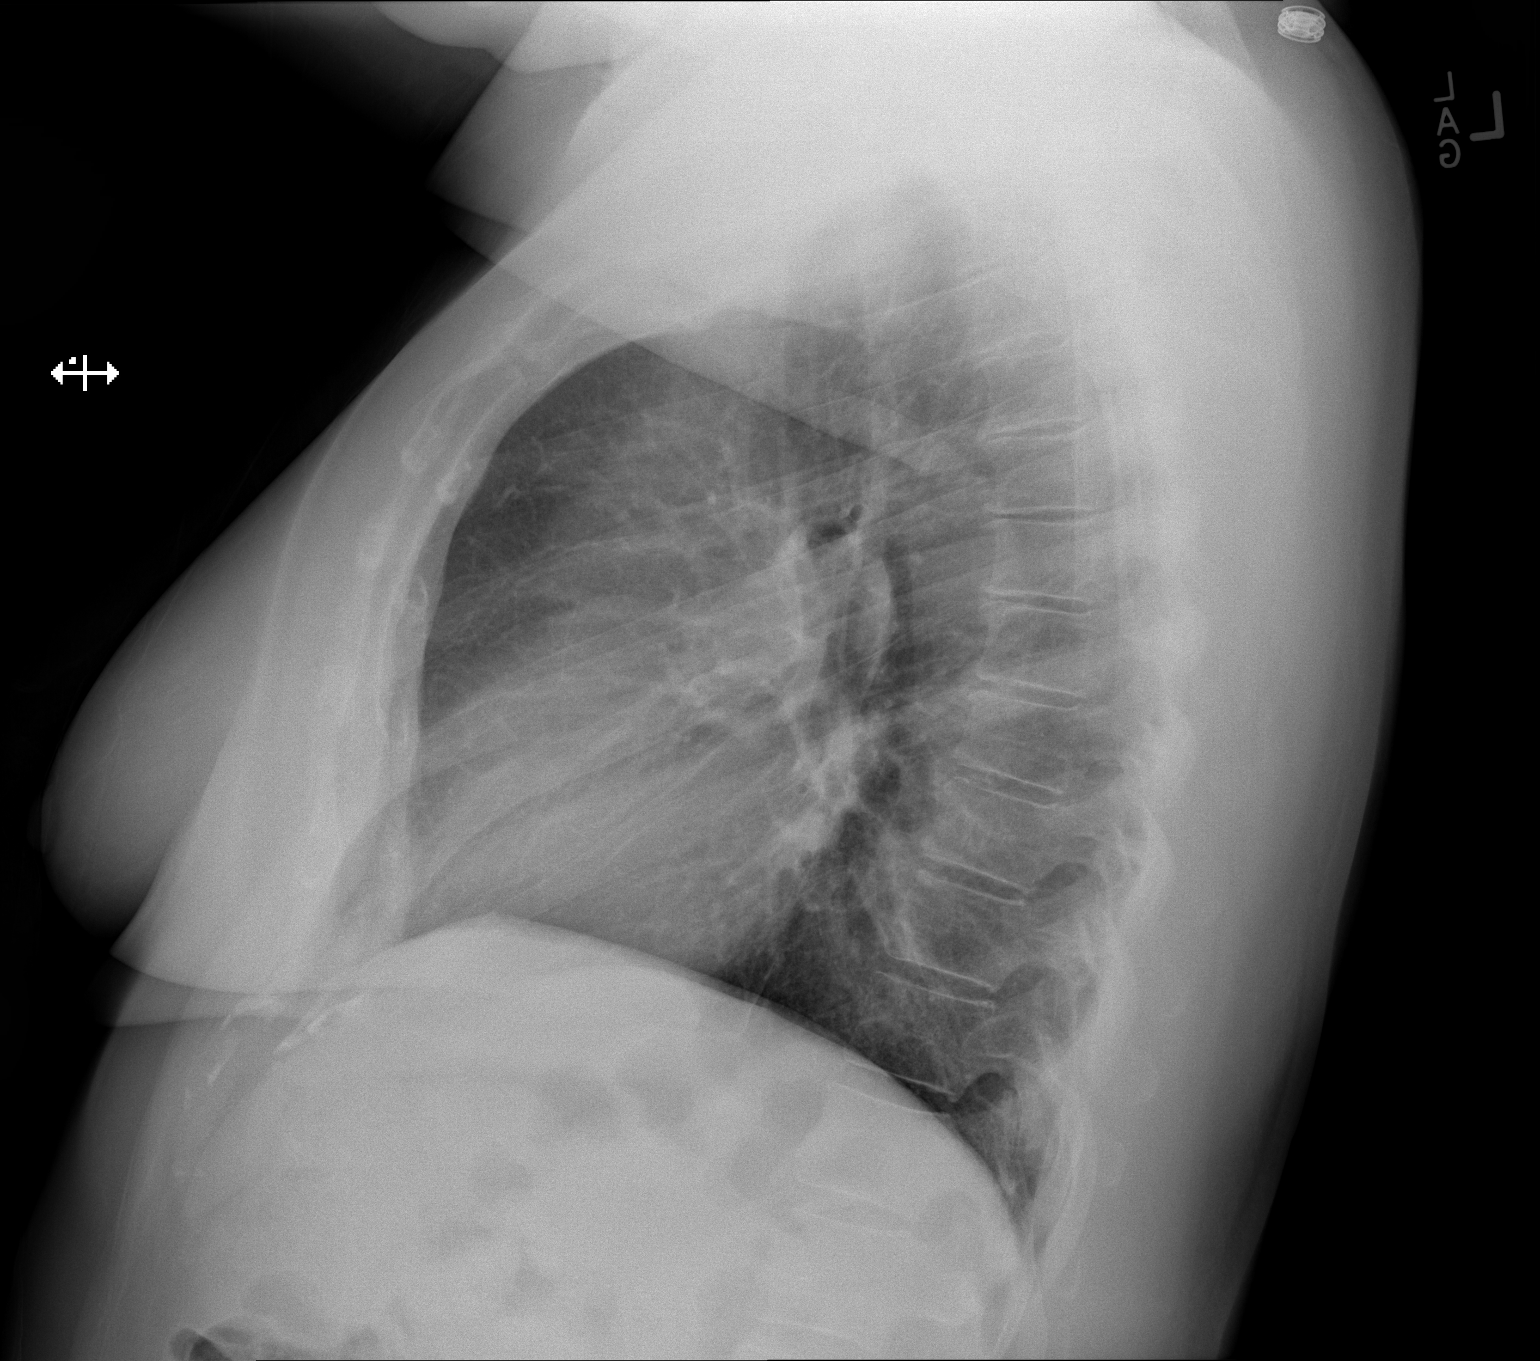

[2 of 2 positions shown; findings below may reference images not displayed]

FINDINGS: Normal heart size and mediastinal contours. No acute infiltrate or
edema. No effusion or pneumothorax. No acute osseous findings.
IMPRESSION: No active cardiopulmonary disease.

## 2016-08-02 ENCOUNTER — Other Ambulatory Visit: Payer: Self-pay | Admitting: Obstetrics and Gynecology

## 2016-08-02 DIAGNOSIS — Z1231 Encounter for screening mammogram for malignant neoplasm of breast: Secondary | ICD-10-CM

## 2016-09-13 ENCOUNTER — Ambulatory Visit: Payer: BC Managed Care – PPO

## 2016-12-19 ENCOUNTER — Ambulatory Visit
Admission: RE | Admit: 2016-12-19 | Discharge: 2016-12-19 | Disposition: A | Discharge status: Home / Self Care | Payer: BC Managed Care – PPO | Source: Ambulatory Visit | Attending: Obstetrics and Gynecology | Admitting: Obstetrics and Gynecology

## 2016-12-19 DIAGNOSIS — Z1231 Encounter for screening mammogram for malignant neoplasm of breast: Secondary | ICD-10-CM

## 2017-03-27 ENCOUNTER — Encounter: Payer: Self-pay | Admitting: Physician Assistant

## 2017-03-27 ENCOUNTER — Other Ambulatory Visit: Payer: Self-pay | Admitting: Physician Assistant

## 2017-08-12 ENCOUNTER — Other Ambulatory Visit: Payer: Self-pay | Admitting: Obstetrics and Gynecology

## 2017-08-12 DIAGNOSIS — Z1231 Encounter for screening mammogram for malignant neoplasm of breast: Secondary | ICD-10-CM

## 2017-12-20 ENCOUNTER — Ambulatory Visit
Admission: RE | Admit: 2017-12-20 | Discharge: 2017-12-20 | Disposition: A | Discharge status: Home / Self Care | Payer: BC Managed Care – PPO | Source: Ambulatory Visit | Attending: Obstetrics and Gynecology | Admitting: Obstetrics and Gynecology

## 2017-12-20 DIAGNOSIS — Z1231 Encounter for screening mammogram for malignant neoplasm of breast: Secondary | ICD-10-CM

## 2018-12-22 ENCOUNTER — Other Ambulatory Visit: Payer: Self-pay

## 2018-12-22 DIAGNOSIS — Z20822 Contact with and (suspected) exposure to covid-19: Secondary | ICD-10-CM

## 2018-12-24 LAB — NOVEL CORONAVIRUS, NAA: SARS-CoV-2, NAA: NOT DETECTED

## 2018-12-25 ENCOUNTER — Other Ambulatory Visit: Payer: Self-pay | Admitting: Obstetrics and Gynecology

## 2018-12-25 DIAGNOSIS — Z1231 Encounter for screening mammogram for malignant neoplasm of breast: Secondary | ICD-10-CM

## 2018-12-30 ENCOUNTER — Other Ambulatory Visit: Payer: Self-pay

## 2018-12-30 ENCOUNTER — Ambulatory Visit
Admission: RE | Admit: 2018-12-30 | Discharge: 2018-12-30 | Disposition: A | Discharge status: Home / Self Care | Payer: BC Managed Care – PPO | Source: Ambulatory Visit | Attending: Obstetrics and Gynecology | Admitting: Obstetrics and Gynecology

## 2018-12-30 DIAGNOSIS — Z1231 Encounter for screening mammogram for malignant neoplasm of breast: Secondary | ICD-10-CM

## 2019-02-18 ENCOUNTER — Ambulatory Visit: Payer: BC Managed Care – PPO

## 2019-04-07 ENCOUNTER — Ambulatory Visit: Payer: BC Managed Care – PPO

## 2019-04-28 ENCOUNTER — Other Ambulatory Visit: Payer: Self-pay | Admitting: Orthopedic Surgery

## 2019-04-28 ENCOUNTER — Other Ambulatory Visit (HOSPITAL_COMMUNITY): Payer: Self-pay | Admitting: Orthopedic Surgery

## 2019-04-28 DIAGNOSIS — Z96651 Presence of right artificial knee joint: Secondary | ICD-10-CM

## 2019-05-05 ENCOUNTER — Encounter (HOSPITAL_COMMUNITY): Payer: BC Managed Care – PPO

## 2019-05-08 ENCOUNTER — Encounter (HOSPITAL_COMMUNITY)
Admission: RE | Admit: 2019-05-08 | Discharge: 2019-05-08 | Disposition: A | Discharge status: Home / Self Care | Payer: Medicare Other | Source: Ambulatory Visit | Attending: Orthopedic Surgery | Admitting: Orthopedic Surgery

## 2019-05-08 ENCOUNTER — Other Ambulatory Visit: Payer: Self-pay

## 2019-05-08 ENCOUNTER — Ambulatory Visit (HOSPITAL_COMMUNITY)
Admission: RE | Admit: 2019-05-08 | Discharge: 2019-05-08 | Disposition: A | Discharge status: Home / Self Care | Payer: Medicare Other | Source: Ambulatory Visit | Attending: Orthopedic Surgery | Admitting: Orthopedic Surgery

## 2019-05-08 DIAGNOSIS — Z96651 Presence of right artificial knee joint: Secondary | ICD-10-CM | POA: Insufficient documentation

## 2019-05-08 MED ORDER — TECHNETIUM TC 99M MEDRONATE IV KIT
19.9000 | PACK | Freq: Once | INTRAVENOUS | Status: AC | PRN
  Administered 2019-05-08: 19.9 via INTRAVENOUS

## 2019-05-28 NOTE — H&P (Signed)
TOTAL KNEE REVISION ADMISSION H&P  Patient is being admitted for right revision total knee arthroplasty.  Subjective:  Chief Complaint:  Right knee pain s/p TKA  HPI: [name], 64 y.o. 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 The indications for the revision of the total knee arthroplasty are loosening of one or more components. Onset of symptoms was gradual starting 1.5 years ago with gradually worsening course since that time.  Prior procedures on the right knee(s) include arthroplasty.  Patient currently rates pain in the right knee(s) at 10 out of 10 with activity. There is night pain, worsening of pain with activity and weight bearing, pain that interferes with activities of daily living, pain with passive range of motion and joint swelling.  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 This condition presents safety issues increasing the risk of falls.  There is no current active infection.  Risks, benefits and expectations were discussed with the patient.  Risks including but not limited to the risk of anesthesia, blood clots, nerve damage, blood vessel damage, failure of the prosthesis, infection and up to and including death.  Patient understand the risks, benefits and expectations and wishes to proceed with surgery.   PCP: Colonel Bald, MD  D/C Plans:       Home   Post-op Meds:       No Rx given   Tranexamic Acid:      To be given - IV   Decadron:      Is to be given  FYI:      ASA  Norco  Celebrex (states that she can use)  DME:    Rx sent for - RW & 3-n-1  PT:   OPPT @ [facility]  Pharmacy: [facility]    Patient Active Problem List   Diagnosis Date  Noted  . Trochanteric bursitis 11/16/2015  . Lumbosacral radiculitis 03/28/2015  . S/P hysterectomy 03/11/2013   Past Medical History:  Diagnosis Date  . Anemia   . Hyperlipidemia   . Seasonal allergies   . Sleep apnea    Does not use CPAP every night  . SVD (spontaneous vaginal delivery)    x 2    Past Surgical History:  Procedure Laterality Date  . COLONOSCOPY  2008  . DILATION AND CURETTAGE OF UTERUS  04/2011  . POLYPECTOMY  05/28/2011   Procedure: POLYPECTOMY;  Surgeon: Avel Sensor, MD;  Location: [facility];  Service: Gynecology;  Laterality: N/A;  . ROBOTIC ASSISTED TOTAL HYSTERECTOMY Bilateral 03/11/2013   Procedure: ROBOTIC ASSISTED TOTAL HYSTERECTOMY WITH BILATERAL SALPINGO-OOPHORECTOMY;  Surgeon: Marvene Staff, MD;  Location: [facility];  Service: Gynecology;  Laterality: Bilateral;  . svd     x 2  . TONSILLECTOMY    . TUBAL LIGATION      No current facility-administered medications for this encounter.   Current Outpatient Medications  Medication Sig Dispense Refill Last Dose  . APPLE CIDER VINEGAR PO Take 1 tablet by mouth daily.     Marland Kitchen atorvastatin (LIPITOR) 40 MG tablet Take 40 mg by mouth at bedtime.      . celecoxib (CELEBREX) 100 MG capsule Take 100 mg by mouth at bedtime.     . Cholecalciferol (VITAMIN D3)  1.25 MG (50000 UT) CAPS Take 50,000 Units by mouth every Wednesday.     . fluticasone (FLONASE) 50 MCG/ACT nasal spray Place 1 spray into both nostrils daily as needed (allergies.).      Marland Kitchen gabapentin (NEURONTIN) 100 MG capsule Take 200 mg by mouth daily as needed for severe pain.     Marland Kitchen OVER THE COUNTER MEDICATION Take 1 capsule by mouth daily. Selenex [place] Best for Glutathione Production, Immune, Liver & Anti-Aging Support-NAC 600 mg/SelenoExcell Selenium 200 mcg/Red Orange Complex 100 mg     . PENNSAID 2 % SOLN Apply 2 puffs topically daily as needed for pain.     Marland Kitchen scopolamine (TRANSDERM-SCOP, 1.5 MG,) 1 MG/3DAYS Place 1 patch onto the skin daily as needed  (dizziness/motion sickness with travel).      . TURMERIC PO Take 1 capsule by mouth daily.     . Vitamin D, Ergocalciferol, (DRISDOL) 1.25 MG (50000 UNIT) CAPS capsule Take 50,000 Units by mouth every Wednesday.      Allergies  Allergen Reactions  . Ibuprofen Rash  . Naproxen Sodium Rash    Social History   Tobacco Use  . Smoking status: Never Smoker  . Smokeless tobacco: Never Used  Substance Use Topics  . Alcohol use: Yes    Comment: wine - weekends      Review of Systems  Constitutional: Negative.   HENT: Negative.   Eyes: Negative.   Respiratory: Negative.   Cardiovascular: Negative.   Gastrointestinal: Negative.   Genitourinary: Negative.   Musculoskeletal: Positive for joint pain.  Skin: Negative.   Neurological: Negative.   Endo/Heme/Allergies: Positive for environmental allergies.  Psychiatric/Behavioral: Negative.       Objective:  Physical Exam  Constitutional: She is oriented to person, place, and time. She appears well-developed.  HENT:  Head: Normocephalic.  Eyes: Pupils are equal, round, and reactive to light.  Neck: No JVD present. No tracheal deviation present. No thyromegaly present.  Cardiovascular: Normal rate, regular rhythm and intact distal pulses.  Respiratory: Effort normal and breath sounds normal. No respiratory distress. She has no wheezes.  GI: Soft. There is no abdominal tenderness. There is no guarding.  Musculoskeletal:     Cervical back: Neck supple.     Right knee: Swelling and bony tenderness present. No deformity, erythema, ecchymosis or lacerations (healed previous incision). Decreased range of motion. Tenderness present.  Lymphadenopathy:    She has no cervical adenopathy.  Neurological: She is alert and oriented to person, place, and time.  Skin: Skin is warm and dry.  Psychiatric: She has a normal mood and affect.     Labs:  Estimated body mass index is 35.3 kg/m as calculated from the following:   Height as of  03/11/13: 5\' 2"  (1.575 m).   Weight as of 03/11/13: 87.5 kg.  Imaging Review Plain radiographs demonstrate failure of the right knee. The overall alignment is neutral.There is evidence of loosening. The bone quality appears to be good for age and reported activity level.     Assessment/Plan:  Right knee with failed previous arthroplasty.   The patient history, physical examination, clinical judgment of the provider and imaging studies are consistent with failure of the right knee, previous total knee arthroplasty. Revision total knee arthroplasty is deemed medically necessary. The treatment options including medical management, injection therapy, arthroscopy and revision arthroplasty were discussed at length. The risks and benefits of revision total knee arthroplasty were presented and reviewed. The risks due to aseptic loosening, infection, stiffness, patella tracking  problems, thromboembolic complications and other imponderables were discussed. The patient acknowledged the explanation, agreed to proceed with the plan and consent was signed. Patient is being admitted for treatment for surgery, pain control, PT, OT, prophylactic antibiotics, VTE prophylaxis, progressive ambulation and ADL's and discharge planning.The patient is planning to be discharged home.     [place] Pugh Taima Rada   PA-C  05/28/2019, 10:06 AM

## 2019-06-02 ENCOUNTER — Encounter (HOSPITAL_COMMUNITY): Payer: Self-pay

## 2019-06-02 ENCOUNTER — Encounter (HOSPITAL_COMMUNITY)
Admission: RE | Admit: 2019-06-02 | Discharge: 2019-06-02 | Disposition: A | Discharge status: Home / Self Care | Payer: Medicare Other | Source: Ambulatory Visit | Attending: Orthopedic Surgery | Admitting: Orthopedic Surgery

## 2019-06-02 ENCOUNTER — Other Ambulatory Visit: Payer: Self-pay

## 2019-06-02 ENCOUNTER — Encounter (HOSPITAL_COMMUNITY): Admission: RE | Admit: 2019-06-02 | Payer: Medicare Other | Source: Ambulatory Visit

## 2019-06-02 DIAGNOSIS — Z01818 Encounter for other preprocedural examination: Secondary | ICD-10-CM | POA: Insufficient documentation

## 2019-06-02 HISTORY — DX: Prediabetes: R73.03

## 2019-06-02 NOTE — Progress Notes (Signed)
PCP - Dr. Marlou Porch Cardiologist -   Chest x-ray -  EKG -  Stress Test -  ECHO -  Cardiac Cath -   Sleep Study -  CPAP -   Fasting Blood Sugar -  Checks Blood Sugar _____ times a day  Blood Thinner Instructions: Aspirin Instructions: Last Dose:  Anesthesia review:   Patient denies shortness of breath, fever, cough and chest pain at PAT appointment   Patient verbalized understanding of instructions that were given to them at the PAT appointment. Patient was also instructed that they will need to review over the PAT instructions again at home before surgery.

## 2019-06-02 NOTE — Patient Instructions (Addendum)
DUE TO COVID-19 ONLY ONE VISITOR IS ALLOWED TO COME WITH YOU AND STAY IN THE WAITING ROOM ONLY DURING PRE OP AND PROCEDURE DAY OF SURGERY. THE 1 VISITOR MAY VISIT WITH YOU AFTER SURGERY IN YOUR PRIVATE ROOM DURING VISITING HOURS ONLY!  YOU NEED TO HAVE A COVID 19 TEST ON: 06/08/19 @ 1:00 pm , THIS TEST MUST BE DONE BEFORE SURGERY, COME  [place], [place] , [address].  ([place]) ONCE YOUR COVID TEST IS COMPLETED, PLEASE BEGIN THE QUARANTINE INSTRUCTIONS AS OUTLINED IN YOUR HANDOUT.                [name]   Your procedure is scheduled on: 06/11/19   Report to [facility] Main  Entrance   Report to admitting at: 6:00 AM     Call this number if you have problems the morning of surgery [phone]    Remember:    [place], NO [place].     Take these medicines the morning of surgery with A SIP OF WATER: N/A. Use Flonase and gabapentin as needed.                                 You may not have any metal on your body including hair pins and              piercings  Do not wear jewelry, make-up, lotions, powders or perfumes, deodorant             Do not wear nail polish on your fingernails.  Do not shave  48 hours prior to surgery.             Do not bring valuables to the hospital. [place].  Contacts, dentures or bridgework may not be worn into surgery.  Leave suitcase in the car. After surgery it may be brought to your room.     Patients discharged the day of surgery will not be allowed to drive home. IF YOU ARE HAVING SURGERY AND GOING HOME THE SAME DAY, YOU MUST HAVE AN ADULT TO DRIVE YOU HOME AND BE WITH YOU FOR 24 HOURS. YOU MAY GO HOME BY TAXI OR UBER OR ORTHERWISE, BUT AN ADULT MUST ACCOMPANY YOU HOME AND STAY WITH YOU FOR 24 HOURS.  Name and phone number of your driver:  Special Instructions: N/A   Please read over the following fact sheets you were given: _____________________________________________________________________             NO SOLID FOOD AFTER MIDNIGHT THE NIGHT PRIOR TO SURGERY. NOTHING BY MOUTH EXCEPT CLEAR LIQUIDS UNTIL: 5:30 am . PLEASE FINISH ENSURE DRINK PER SURGEON ORDER  WHICH NEEDS TO BE COMPLETED AT: 5:30 am .   CLEAR LIQUID DIET   Foods Allowed                                                                     Foods Excluded  Coffee and tea, regular and decaf  liquids that you cannot  Plain Jell-O any favor except red or purple                                           see through such as: Fruit ices (not with fruit pulp)                                     milk, soups, orange juice  Iced Popsicles                                    All solid food Carbonated beverages, regular and diet                                    Cranberry, grape and apple juices Sports drinks like Gatorade Lightly seasoned clear broth or consume(fat free) Sugar, honey syrup  Sample Menu Breakfast                                Lunch                                     Supper Cranberry juice                    Beef broth                            Chicken broth Jell-O                                     Grape juice                           Apple juice Coffee or tea                        Jell-O                                      Popsicle                                                Coffee or tea                        Coffee or tea  _____________________________________________________________________  [facility] - Preparing for Surgery Before surgery, you can play an important role.  Because skin is not sterile, your skin needs to be as free of germs as possible.  You can reduce the number of germs on your skin by washing with CHG (chlorahexidine gluconate) soap before surgery.  CHG is an antiseptic cleaner which kills  germs and bonds with the  skin to continue killing germs even after washing. Please DO NOT use if you have an allergy to CHG or antibacterial soaps.  If your skin becomes reddened/irritated stop using the CHG and inform your nurse when you arrive at Short Stay. Do not shave (including legs and underarms) for at least 48 hours prior to the first CHG shower.  You may shave your face/neck. Please follow these instructions carefully:  1.  Shower with CHG Soap the night before surgery and the  morning of Surgery.  2.  If you choose to wash your hair, wash your hair first as usual with your  normal  shampoo.  3.  After you shampoo, rinse your hair and body thoroughly to remove the  shampoo.                           4.  Use CHG as you would any other liquid soap.  You can apply chg directly  to the skin and wash                       Gently with a scrungie or clean washcloth.  5.  Apply the CHG Soap to your body ONLY FROM THE NECK DOWN.   Do not use on face/ open                           Wound or open sores. Avoid contact with eyes, ears mouth and genitals (private parts).                       Wash face,  Genitals (private parts) with your normal soap.             6.  Wash thoroughly, paying special attention to the area where your surgery  will be performed.  7.  Thoroughly rinse your body with warm water from the neck down.  8.  DO NOT shower/wash with your normal soap after using and rinsing off  the CHG Soap.                9.  Pat yourself dry with a clean towel.            10.  Wear clean pajamas.            11.  Place clean sheets on your bed the night of your first shower and do not  sleep with pets. Day of Surgery : Do not apply any lotions/deodorants the morning of surgery.  Please wear clean clothes to the hospital/surgery center.  FAILURE TO FOLLOW THESE INSTRUCTIONS MAY RESULT IN THE CANCELLATION OF YOUR SURGERY PATIENT SIGNATURE_________________________________  NURSE  SIGNATURE__________________________________  ________________________________________________________________________   [name]  An incentive spirometer is a tool that can help keep your lungs clear and active. This tool measures how well you are filling your lungs with each breath. Taking long deep breaths may help reverse or decrease the chance of developing breathing (pulmonary) problems (especially infection) following:  A long period of time when you are unable to move or be active. BEFORE THE PROCEDURE   If the spirometer includes an indicator to show your best effort, your nurse or respiratory therapist will set it to a desired goal.  If possible, sit up straight or lean slightly forward. Try not to slouch.  Hold the incentive spirometer in an  upright position. INSTRUCTIONS FOR USE  1. Sit on the edge of your bed if possible, or sit up as far as you can in bed or on a chair. 2. Hold the incentive spirometer in an upright position. 3. Breathe out normally. 4. Place the mouthpiece in your mouth and seal your lips tightly around it. 5. Breathe in slowly and as deeply as possible, raising the piston or the ball toward the top of the column. 6. Hold your breath for 3-5 seconds or for as long as possible. Allow the piston or ball to fall to the bottom of the column. 7. Remove the mouthpiece from your mouth and breathe out normally. 8. Rest for a few seconds and repeat Steps 1 through 7 at least 10 times every 1-2 hours when you are awake. Take your time and take a few normal breaths between deep breaths. 9. The spirometer may include an indicator to show your best effort. Use the indicator as a goal to work toward during each repetition. 10. After each set of 10 deep breaths, practice coughing to be sure your lungs are clear. If you have an incision (the cut made at the time of surgery), support your incision when coughing by placing a pillow or rolled up towels firmly  against it. Once you are able to get out of bed, walk around indoors and cough well. You may stop using the incentive spirometer when instructed by your caregiver.  RISKS AND COMPLICATIONS  Take your time so you do not get dizzy or light-headed.  If you are in pain, you may need to take or ask for pain medication before doing incentive spirometry. It is harder to take a deep breath if you are having pain. AFTER USE  Rest and breathe slowly and easily.  It can be helpful to keep track of a log of your progress. Your caregiver can provide you with a simple table to help with this. If you are using the spirometer at home, follow these instructions: [place] IF:   You are having difficultly using the spirometer.  You have trouble using the spirometer as often as instructed.  Your pain medication is not giving enough relief while using the spirometer.  You develop fever of 100.5 F (38.1 C) or higher. SEEK IMMEDIATE MEDICAL CARE IF:   You cough up bloody sputum that had not been present before.  You develop fever of 102 F (38.9 C) or greater.  You develop worsening pain at or near the incision site. MAKE SURE YOU:   Understand these instructions.  Will watch your condition.  Will get help right away if you are not doing well or get worse. Document Released: 06/11/2006 Document Revised: 04/23/2011 Document Reviewed: 08/12/2006 [facility] Patient Information 2014 [place], [place].   ________________________________________________________________________

## 2019-06-03 ENCOUNTER — Encounter: Payer: Self-pay | Admitting: Orthopaedic Surgery

## 2019-06-03 ENCOUNTER — Ambulatory Visit (INDEPENDENT_AMBULATORY_CARE_PROVIDER_SITE_OTHER): Payer: Medicare Other | Admitting: Orthopaedic Surgery

## 2019-06-03 VITALS — Ht 63.0 in | Wt 207.0 lb

## 2019-06-03 DIAGNOSIS — Z96651 Presence of right artificial knee joint: Secondary | ICD-10-CM

## 2019-06-03 DIAGNOSIS — T8484XA Pain due to internal orthopedic prosthetic devices, implants and grafts, initial encounter: Secondary | ICD-10-CM | POA: Diagnosis not present

## 2019-06-03 NOTE — Progress Notes (Signed)
Office Visit Note   Patient: [name]           Date of Birth: 09/28/1955           MRN: [id number] Visit Date: 06/03/2019              Requested by: Colonel Bald, MD [address] [place],  [place] [address] PCP: Colonel Bald, MD   Assessment & Plan: Visit Diagnoses:  1. Pain due to total right knee replacement, initial encounter ([place])     Plan: I did go over her x-rays with her and mainly went over showing her a knee replacement model and described what knee revision surgery means.  I gave her reassurance that the physician that is doing her revision next week is someone who I trust and think highly of and is someone who does perform a lot of revision surgery.  She was very pleased to hear my opinion and will proceed with surgery next week with my colleague.  Follow-Up Instructions: Return if symptoms worsen or fail to improve.   Orders:  No orders of the defined types were placed in this encounter.  No orders of the defined types were placed in this encounter.     Procedures: No procedures performed   Clinical Data: No additional findings.   Subjective: No chief complaint on file. The patient is a very pleasant 64 year old female who comes today mainly for second opinion as it relates to a failed right total knee arthroplasty.  Her right knee was replaced in 2019 by one of our colleagues in town.  Unfortunately she has developed signs of loosening.  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  There is a three-phase bone scan on the canopy system and I do have x-rays review of her knee.  She is comfortable with the orthopedic surgeon who is revising her knee next week which is from a different group.  Her cousin wanted me to see her to give her some reassurance that this is the right surgery for her.  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  It has been painful since then and has been slowly getting worse.  She denies any fever or chills or redness.   The knee does swell on her and it does not feel completely stable.  It has been recommended she undergo a complete revision arthroplasty of that knee.  This is scheduled for April 29.  HPI  Review of Systems She currently denies any headache, chest pain, shortness of breath, fever, chills, nausea, vomiting  Objective: Vital Signs: Ht 5\' 3"  (1.6 m)   Wt 207 lb (93.9 kg)   BMI 36.67 kg/m   Physical Exam She is alert and orient x3 and in no acute distress Ortho Exam Examination of her right knee shows that it is warm.  There is no redness.  There is at least a mild effusion.  There was not a significant amount of instability when stressing the knee through flexion and extension as well as varus and valgus but it is uncomfortable for me to do this to her.  Her foot is well-perfused.  Her hip exam is normal.  Her left knee exam is normal. Specialty Comments:  No specialty comments available.  Imaging: No results found. The plain films of her left knee does show lucency around the lateral tibial plateau and there may be some loosening at the femoral component.  The three-phase bone scan was done certainly early in  the postoperative period but it does show more uptake than you would see sometimes at this standpoint.  PMFS History: Patient Active Problem List   Diagnosis Date Noted  . Trochanteric bursitis 11/16/2015  . Lumbosacral radiculitis 03/28/2015  . S/P hysterectomy 03/11/2013   Past Medical History:  Diagnosis Date  . Anemia   . Hyperlipidemia   . Pre-diabetes   . Seasonal allergies   . Sleep apnea    Does not use CPAP every night  . SVD (spontaneous vaginal delivery)    x 2    History reviewed. No pertinent family history.  Past Surgical History:  Procedure Laterality Date  . COLONOSCOPY  2008  . DILATION AND CURETTAGE OF UTERUS  04/2011  . POLYPECTOMY  05/28/2011   Procedure: POLYPECTOMY;  Surgeon: Avel Sensor, MD;  Location: [facility];  Service: Gynecology;   Laterality: N/A;  . ROBOTIC ASSISTED TOTAL HYSTERECTOMY Bilateral 03/11/2013   Procedure: ROBOTIC ASSISTED TOTAL HYSTERECTOMY WITH BILATERAL SALPINGO-OOPHORECTOMY;  Surgeon: Marvene Staff, MD;  Location: [facility];  Service: Gynecology;  Laterality: Bilateral;  . SHOULDER ARTHROSCOPY Right 2015  . svd     x 2  . TONSILLECTOMY    . TUBAL LIGATION     Social History   Occupational History  . Not on file  Tobacco Use  . Smoking status: Never Smoker  . Smokeless tobacco: Never Used  Substance and Sexual Activity  . Alcohol use: Yes    Comment: wine - weekends  . Drug use: No  . Sexual activity: Yes    Birth control/protection: Surgical

## 2019-06-04 ENCOUNTER — Encounter (HOSPITAL_COMMUNITY)
Admission: RE | Admit: 2019-06-04 | Discharge: 2019-06-04 | Disposition: A | Discharge status: Home / Self Care | Payer: Medicare Other | Source: Ambulatory Visit | Attending: Orthopedic Surgery | Admitting: Orthopedic Surgery

## 2019-06-04 ENCOUNTER — Other Ambulatory Visit: Payer: Self-pay

## 2019-06-04 DIAGNOSIS — Z01812 Encounter for preprocedural laboratory examination: Secondary | ICD-10-CM | POA: Insufficient documentation

## 2019-06-04 LAB — CBC
HCT: 40 % (ref 36.0–46.0)
Hemoglobin: 12.3 g/dL (ref 12.0–15.0)
MCH: 25.2 pg — ABNORMAL LOW (ref 26.0–34.0)
MCHC: 30.8 g/dL (ref 30.0–36.0)
MCV: 81.8 fL (ref 80.0–100.0)
Platelets: 270 10*3/uL (ref 150–400)
RBC: 4.89 MIL/uL (ref 3.87–5.11)
RDW: 15.3 % (ref 11.5–15.5)
WBC: 6.3 10*3/uL (ref 4.0–10.5)
nRBC: 0 % (ref 0.0–0.2)

## 2019-06-04 LAB — ABO/RH: ABO/RH(D): A POS

## 2019-06-04 LAB — HEMOGLOBIN A1C
Hgb A1c MFr Bld: 6.3 % — ABNORMAL HIGH (ref 4.8–5.6)
Mean Plasma Glucose: 134.11 mg/dL

## 2019-06-04 LAB — SURGICAL PCR SCREEN
MRSA, PCR: NEGATIVE
Staphylococcus aureus: NEGATIVE

## 2019-06-08 ENCOUNTER — Other Ambulatory Visit (HOSPITAL_COMMUNITY)
Admission: RE | Admit: 2019-06-08 | Discharge: 2019-06-08 | Disposition: A | Discharge status: Home / Self Care | Payer: Medicare Other | Source: Ambulatory Visit | Attending: Orthopedic Surgery | Admitting: Orthopedic Surgery

## 2019-06-08 DIAGNOSIS — Z20822 Contact with and (suspected) exposure to covid-19: Secondary | ICD-10-CM | POA: Diagnosis not present

## 2019-06-08 DIAGNOSIS — Z01812 Encounter for preprocedural laboratory examination: Secondary | ICD-10-CM | POA: Diagnosis present

## 2019-06-08 LAB — SARS CORONAVIRUS 2 (TAT 6-24 HRS): SARS Coronavirus 2: NEGATIVE

## 2019-06-10 ENCOUNTER — Telehealth (HOSPITAL_COMMUNITY): Payer: Self-pay | Admitting: *Deleted

## 2019-06-11 ENCOUNTER — Ambulatory Visit (HOSPITAL_COMMUNITY): Payer: Medicare Other | Admitting: Certified Registered"

## 2019-06-11 ENCOUNTER — Encounter (HOSPITAL_COMMUNITY): Payer: Self-pay | Admitting: Orthopedic Surgery

## 2019-06-11 ENCOUNTER — Encounter (HOSPITAL_COMMUNITY)
Admission: RE | Disposition: A | Discharge status: Home / Self Care | Payer: Self-pay | Source: Ambulatory Visit | Attending: Orthopedic Surgery

## 2019-06-11 ENCOUNTER — Observation Stay (HOSPITAL_COMMUNITY)
Admission: RE | Admit: 2019-06-11 | Discharge: 2019-06-12 | Disposition: A | Discharge status: Home / Self Care | Payer: Medicare Other | Source: Ambulatory Visit | Attending: Orthopedic Surgery | Admitting: Orthopedic Surgery

## 2019-06-11 ENCOUNTER — Other Ambulatory Visit: Payer: Self-pay

## 2019-06-11 DIAGNOSIS — Z79899 Other long term (current) drug therapy: Secondary | ICD-10-CM | POA: Insufficient documentation

## 2019-06-11 DIAGNOSIS — T84032A Mechanical loosening of internal right knee prosthetic joint, initial encounter: Secondary | ICD-10-CM | POA: Insufficient documentation

## 2019-06-11 DIAGNOSIS — Y792 Prosthetic and other implants, materials and accessory orthopedic devices associated with adverse incidents: Secondary | ICD-10-CM | POA: Diagnosis not present

## 2019-06-11 DIAGNOSIS — M25461 Effusion, right knee: Secondary | ICD-10-CM | POA: Insufficient documentation

## 2019-06-11 DIAGNOSIS — T8484XA Pain due to internal orthopedic prosthetic devices, implants and grafts, initial encounter: Secondary | ICD-10-CM | POA: Insufficient documentation

## 2019-06-11 DIAGNOSIS — E785 Hyperlipidemia, unspecified: Secondary | ICD-10-CM | POA: Insufficient documentation

## 2019-06-11 DIAGNOSIS — J302 Other seasonal allergic rhinitis: Secondary | ICD-10-CM | POA: Diagnosis not present

## 2019-06-11 DIAGNOSIS — G4730 Sleep apnea, unspecified: Secondary | ICD-10-CM | POA: Insufficient documentation

## 2019-06-11 DIAGNOSIS — Z96651 Presence of right artificial knee joint: Secondary | ICD-10-CM

## 2019-06-11 DIAGNOSIS — T84092A Other mechanical complication of internal right knee prosthesis, initial encounter: Secondary | ICD-10-CM | POA: Diagnosis not present

## 2019-06-11 DIAGNOSIS — Z791 Long term (current) use of non-steroidal anti-inflammatories (NSAID): Secondary | ICD-10-CM | POA: Diagnosis not present

## 2019-06-11 DIAGNOSIS — Z886 Allergy status to analgesic agent status: Secondary | ICD-10-CM | POA: Insufficient documentation

## 2019-06-11 HISTORY — PX: TOTAL KNEE REVISION: SHX996

## 2019-06-11 LAB — TYPE AND SCREEN
ABO/RH(D): A POS
Antibody Screen: NEGATIVE

## 2019-06-11 SURGERY — TOTAL KNEE REVISION
Anesthesia: General | Site: Knee | Laterality: Right

## 2019-06-11 MED ORDER — PHENYLEPHRINE 40 MCG/ML (10ML) SYRINGE FOR IV PUSH (FOR BLOOD PRESSURE SUPPORT)
PREFILLED_SYRINGE | INTRAVENOUS | Status: DC | PRN
  Administered 2019-06-11 (×3): 120 ug via INTRAVENOUS
  Administered 2019-06-11 (×2): 80 ug via INTRAVENOUS
  Administered 2019-06-11: 120 ug via INTRAVENOUS

## 2019-06-11 MED ORDER — MORPHINE SULFATE (PF) 4 MG/ML IV SOLN
0.5000 mg | INTRAVENOUS | Status: DC | PRN
  Administered 2019-06-11: 0.52 mg via INTRAVENOUS
  Administered 2019-06-12: 1 mg via INTRAVENOUS
  Filled 2019-06-11 (×2): qty 1

## 2019-06-11 MED ORDER — DIPHENHYDRAMINE HCL 12.5 MG/5ML PO ELIX: 12.5000 mg | ORAL_SOLUTION | ORAL | Status: DC | PRN

## 2019-06-11 MED ORDER — SODIUM CHLORIDE 0.9 % IV SOLN: INTRAVENOUS | Status: DC

## 2019-06-11 MED ORDER — ALBUMIN HUMAN 5 % IV SOLN: INTRAVENOUS | Status: DC | PRN

## 2019-06-11 MED ORDER — BUPIVACAINE HCL (PF) 0.25 % IJ SOLN
INTRAMUSCULAR | Status: DC | PRN
  Administered 2019-06-11: 30 mL

## 2019-06-11 MED ORDER — POLYETHYLENE GLYCOL 3350 17 G PO PACK
17.0000 g | PACK | Freq: Two times a day (BID) | ORAL | Status: DC
  Administered 2019-06-11: 17 g via ORAL
  Filled 2019-06-11: qty 1

## 2019-06-11 MED ORDER — HYDROMORPHONE HCL 1 MG/ML IJ SOLN: 0.2500 mg | INTRAMUSCULAR | Status: DC | PRN

## 2019-06-11 MED ORDER — PHENOL 1.4 % MT LIQD: 1.0000 | OROMUCOSAL | Status: DC | PRN

## 2019-06-11 MED ORDER — DEXAMETHASONE SODIUM PHOSPHATE 10 MG/ML IJ SOLN
10.0000 mg | Freq: Once | INTRAMUSCULAR | Status: AC
  Administered 2019-06-11: 10 mg via INTRAVENOUS

## 2019-06-11 MED ORDER — CEFAZOLIN SODIUM-DEXTROSE 2-4 GM/100ML-% IV SOLN
2.0000 g | Freq: Four times a day (QID) | INTRAVENOUS | Status: AC
  Administered 2019-06-11 (×2): 2 g via INTRAVENOUS
  Filled 2019-06-11 (×2): qty 100

## 2019-06-11 MED ORDER — FENTANYL CITRATE (PF) 100 MCG/2ML IJ SOLN
50.0000 ug | INTRAMUSCULAR | Status: DC
  Administered 2019-06-11 (×2): 50 ug via INTRAVENOUS
  Filled 2019-06-11: qty 2

## 2019-06-11 MED ORDER — PROPOFOL 500 MG/50ML IV EMUL
INTRAVENOUS | Status: DC | PRN
  Administered 2019-06-11: 75 ug/kg/min via INTRAVENOUS

## 2019-06-11 MED ORDER — LACTATED RINGERS IV SOLN: INTRAVENOUS | Status: DC

## 2019-06-11 MED ORDER — DOCUSATE SODIUM 100 MG PO CAPS
100.0000 mg | ORAL_CAPSULE | Freq: Two times a day (BID) | ORAL | Status: DC
  Administered 2019-06-11 – 2019-06-12 (×2): 100 mg via ORAL
  Filled 2019-06-11 (×2): qty 1

## 2019-06-11 MED ORDER — STERILE WATER FOR IRRIGATION IR SOLN
Status: DC | PRN
  Administered 2019-06-11: 2000 mL

## 2019-06-11 MED ORDER — ONDANSETRON HCL 4 MG/2ML IJ SOLN: 4.0000 mg | Freq: Four times a day (QID) | INTRAMUSCULAR | Status: DC | PRN

## 2019-06-11 MED ORDER — MIDAZOLAM HCL 2 MG/2ML IJ SOLN
1.0000 mg | INTRAMUSCULAR | Status: DC
  Administered 2019-06-11 (×2): 1 mg via INTRAVENOUS
  Filled 2019-06-11: qty 2

## 2019-06-11 MED ORDER — LIP MEDEX EX OINT
TOPICAL_OINTMENT | CUTANEOUS | Status: AC
  Filled 2019-06-11: qty 7

## 2019-06-11 MED ORDER — KETOROLAC TROMETHAMINE 30 MG/ML IJ SOLN
INTRAMUSCULAR | Status: AC
  Filled 2019-06-11: qty 1

## 2019-06-11 MED ORDER — DEXAMETHASONE SODIUM PHOSPHATE 10 MG/ML IJ SOLN
10.0000 mg | Freq: Once | INTRAMUSCULAR | Status: AC
  Administered 2019-06-12: 10 mg via INTRAVENOUS
  Filled 2019-06-11: qty 1

## 2019-06-11 MED ORDER — TRANEXAMIC ACID-NACL 1000-0.7 MG/100ML-% IV SOLN
1000.0000 mg | Freq: Once | INTRAVENOUS | Status: AC
  Administered 2019-06-11: 1000 mg via INTRAVENOUS
  Filled 2019-06-11: qty 100

## 2019-06-11 MED ORDER — SODIUM CHLORIDE 0.9 % IR SOLN
Status: DC | PRN
  Administered 2019-06-11: 3000 mL

## 2019-06-11 MED ORDER — ATORVASTATIN CALCIUM 40 MG PO TABS
40.0000 mg | ORAL_TABLET | Freq: Every day | ORAL | Status: DC
  Administered 2019-06-11: 40 mg via ORAL
  Filled 2019-06-11: qty 1

## 2019-06-11 MED ORDER — POVIDONE-IODINE 10 % EX SWAB
2.0000 "application " | Freq: Once | CUTANEOUS | Status: AC
  Administered 2019-06-11: 2 via TOPICAL

## 2019-06-11 MED ORDER — TRANEXAMIC ACID-NACL 1000-0.7 MG/100ML-% IV SOLN
1000.0000 mg | INTRAVENOUS | Status: AC
  Administered 2019-06-11: 1000 mg via INTRAVENOUS
  Filled 2019-06-11: qty 100

## 2019-06-11 MED ORDER — FLUTICASONE PROPIONATE 50 MCG/ACT NA SUSP
1.0000 | Freq: Every day | NASAL | Status: DC | PRN
  Filled 2019-06-11: qty 16

## 2019-06-11 MED ORDER — FERROUS SULFATE 325 (65 FE) MG PO TABS
325.0000 mg | ORAL_TABLET | Freq: Two times a day (BID) | ORAL | Status: DC
  Administered 2019-06-11 – 2019-06-12 (×2): 325 mg via ORAL
  Filled 2019-06-11 (×2): qty 1

## 2019-06-11 MED ORDER — METHOCARBAMOL 500 MG PO TABS
500.0000 mg | ORAL_TABLET | Freq: Four times a day (QID) | ORAL | Status: DC | PRN
  Administered 2019-06-11 – 2019-06-12 (×2): 500 mg via ORAL
  Filled 2019-06-11 (×2): qty 1

## 2019-06-11 MED ORDER — HYDROCODONE-ACETAMINOPHEN 5-325 MG PO TABS
1.0000 | ORAL_TABLET | ORAL | Status: DC | PRN
  Administered 2019-06-11: 2 via ORAL
  Filled 2019-06-11: qty 2

## 2019-06-11 MED ORDER — ONDANSETRON HCL 4 MG/2ML IJ SOLN
INTRAMUSCULAR | Status: DC | PRN
  Administered 2019-06-11: 4 mg via INTRAVENOUS

## 2019-06-11 MED ORDER — CELECOXIB 200 MG PO CAPS
200.0000 mg | ORAL_CAPSULE | Freq: Two times a day (BID) | ORAL | Status: DC
  Administered 2019-06-11 – 2019-06-12 (×2): 200 mg via ORAL
  Filled 2019-06-11 (×2): qty 1

## 2019-06-11 MED ORDER — SODIUM CHLORIDE (PF) 0.9 % IJ SOLN
INTRAMUSCULAR | Status: DC | PRN
  Administered 2019-06-11: 30 mL via INTRAVENOUS

## 2019-06-11 MED ORDER — ALUM & MAG HYDROXIDE-SIMETH 200-200-20 MG/5ML PO SUSP: 15.0000 mL | ORAL | Status: DC | PRN

## 2019-06-11 MED ORDER — ACETAMINOPHEN 500 MG PO TABS
1000.0000 mg | ORAL_TABLET | Freq: Three times a day (TID) | ORAL | Status: DC
  Administered 2019-06-11 – 2019-06-12 (×3): 1000 mg via ORAL
  Filled 2019-06-11 (×3): qty 2

## 2019-06-11 MED ORDER — MENTHOL 3 MG MT LOZG: 1.0000 | LOZENGE | OROMUCOSAL | Status: DC | PRN

## 2019-06-11 MED ORDER — CEFAZOLIN SODIUM-DEXTROSE 2-4 GM/100ML-% IV SOLN
2.0000 g | INTRAVENOUS | Status: AC
  Administered 2019-06-11: 2 g via INTRAVENOUS
  Filled 2019-06-11: qty 100

## 2019-06-11 MED ORDER — SODIUM CHLORIDE (PF) 0.9 % IJ SOLN
INTRAMUSCULAR | Status: AC
  Filled 2019-06-11: qty 50

## 2019-06-11 MED ORDER — TOBRAMYCIN SULFATE 1.2 G IJ SOLR
INTRAMUSCULAR | Status: AC
  Filled 2019-06-11: qty 2.4

## 2019-06-11 MED ORDER — GABAPENTIN 100 MG PO CAPS
200.0000 mg | ORAL_CAPSULE | Freq: Every day | ORAL | Status: DC
  Administered 2019-06-12: 200 mg via ORAL
  Filled 2019-06-11: qty 2

## 2019-06-11 MED ORDER — 0.9 % SODIUM CHLORIDE (POUR BTL) OPTIME
TOPICAL | Status: DC | PRN
  Administered 2019-06-11: 1000 mL

## 2019-06-11 MED ORDER — ROPIVACAINE HCL 7.5 MG/ML IJ SOLN
INTRAMUSCULAR | Status: DC | PRN
Start: 2019-06-11 — End: 2019-06-11
  Administered 2019-06-11 (×4): 5 mL via PERINEURAL

## 2019-06-11 MED ORDER — PROMETHAZINE HCL 25 MG/ML IJ SOLN: 6.2500 mg | INTRAMUSCULAR | Status: DC | PRN

## 2019-06-11 MED ORDER — METHOCARBAMOL 500 MG IVPB - SIMPLE MED
500.0000 mg | Freq: Four times a day (QID) | INTRAVENOUS | Status: DC | PRN
  Filled 2019-06-11: qty 50

## 2019-06-11 MED ORDER — PROPOFOL 10 MG/ML IV BOLUS
INTRAVENOUS | Status: DC | PRN
  Administered 2019-06-11: 20 mg via INTRAVENOUS
  Administered 2019-06-11: 100 mg via INTRAVENOUS

## 2019-06-11 MED ORDER — CLONIDINE HCL (ANALGESIA) 100 MCG/ML EP SOLN
EPIDURAL | Status: DC | PRN
  Administered 2019-06-11: 100 ug

## 2019-06-11 MED ORDER — BISACODYL 10 MG RE SUPP: 10.0000 mg | Freq: Every day | RECTAL | Status: DC | PRN

## 2019-06-11 MED ORDER — VANCOMYCIN HCL 1000 MG IV SOLR
INTRAVENOUS | Status: AC
  Filled 2019-06-11: qty 2000

## 2019-06-11 MED ORDER — ACETAMINOPHEN 10 MG/ML IV SOLN: 1000.0000 mg | Freq: Once | INTRAVENOUS | Status: DC | PRN

## 2019-06-11 MED ORDER — ASPIRIN 81 MG PO CHEW
81.0000 mg | CHEWABLE_TABLET | Freq: Two times a day (BID) | ORAL | Status: DC
  Administered 2019-06-11 – 2019-06-12 (×2): 81 mg via ORAL
  Filled 2019-06-11 (×2): qty 1

## 2019-06-11 MED ORDER — ROPIVACAINE HCL 5 MG/ML IJ SOLN
INTRAMUSCULAR | Status: DC | PRN
  Administered 2019-06-11 (×2): 5 mL via PERINEURAL

## 2019-06-11 MED ORDER — METOCLOPRAMIDE HCL 5 MG PO TABS: 5.0000 mg | ORAL_TABLET | Freq: Three times a day (TID) | ORAL | Status: DC | PRN

## 2019-06-11 MED ORDER — MEPERIDINE HCL 50 MG/ML IJ SOLN: 6.2500 mg | INTRAMUSCULAR | Status: DC | PRN

## 2019-06-11 MED ORDER — METOCLOPRAMIDE HCL 5 MG/ML IJ SOLN: 5.0000 mg | Freq: Three times a day (TID) | INTRAMUSCULAR | Status: DC | PRN

## 2019-06-11 MED ORDER — ACETAMINOPHEN 325 MG PO TABS: 325.0000 mg | ORAL_TABLET | Freq: Four times a day (QID) | ORAL | Status: DC | PRN

## 2019-06-11 MED ORDER — BUPIVACAINE IN DEXTROSE 0.75-8.25 % IT SOLN
INTRATHECAL | Status: DC | PRN
  Administered 2019-06-11: 1.8 mL via INTRATHECAL

## 2019-06-11 MED ORDER — HYDROCODONE-ACETAMINOPHEN 7.5-325 MG PO TABS: 1.0000 | ORAL_TABLET | ORAL | Status: DC | PRN

## 2019-06-11 MED ORDER — MAGNESIUM CITRATE PO SOLN: 1.0000 | Freq: Once | ORAL | Status: DC | PRN

## 2019-06-11 MED ORDER — OXYCODONE HCL 5 MG PO TABS
5.0000 mg | ORAL_TABLET | ORAL | Status: DC | PRN
  Administered 2019-06-11: 5 mg via ORAL
  Filled 2019-06-11: qty 1

## 2019-06-11 MED ORDER — ONDANSETRON HCL 4 MG PO TABS: 4.0000 mg | ORAL_TABLET | Freq: Four times a day (QID) | ORAL | Status: DC | PRN

## 2019-06-11 MED ORDER — KETOROLAC TROMETHAMINE 30 MG/ML IJ SOLN
INTRAMUSCULAR | Status: DC | PRN
  Administered 2019-06-11: 30 mg via INTRAVENOUS

## 2019-06-11 MED ORDER — BUPIVACAINE HCL (PF) 0.25 % IJ SOLN
INTRAMUSCULAR | Status: AC
  Filled 2019-06-11: qty 30

## 2019-06-11 SURGICAL SUPPLY — 77 items
ADH SKN CLS APL DERMABOND .7 (GAUZE/BANDAGES/DRESSINGS) ×1
ATTUNE PSRP INSR SZ4 8 KNEE (Insert) ×1 IMPLANT
ATTUNE PSRP INSR SZ4 8MM KNEE (Insert) ×1 IMPLANT
AUG FEM SZ4 4 REV DIST STRL LF (Miscellaneous) ×2 IMPLANT
AUG FEM SZ4 4 REV POST STRL LF (Miscellaneous) ×1 IMPLANT
AUG FEM SZ4 8 REV POST STRL LF (Miscellaneous) ×1 IMPLANT
AUGMENT DISTAL FEM SZ4 4 KNEE (Miscellaneous) ×4 IMPLANT
AUGMENT POST FEM SZ4 4 (Miscellaneous) ×2 IMPLANT
AUGMENT POST FEM SZ4 8 HIP (Miscellaneous) ×2 IMPLANT
BAG DECANTER FOR FLEXI CONT (MISCELLANEOUS) IMPLANT
BAG SPEC THK2 15X12 ZIP CLS (MISCELLANEOUS)
BAG ZIPLOCK 12X15 (MISCELLANEOUS) IMPLANT
BLADE SAW SGTL 11.0X1.19X90.0M (BLADE) IMPLANT
BLADE SAW SGTL 13.0X1.19X90.0M (BLADE) ×3 IMPLANT
BLADE SAW SGTL 81X20 HD (BLADE) ×3 IMPLANT
BLADE SURG SZ10 CARB STEEL (BLADE) ×6 IMPLANT
BNDG ELASTIC 6X5.8 VLCR STR LF (GAUZE/BANDAGES/DRESSINGS) ×3 IMPLANT
BRUSH FEMORAL CANAL (MISCELLANEOUS) IMPLANT
BSPLAT TIB 4 CMNT REV ROT PLAT (Knees) ×1 IMPLANT
CEMENT HV SMART SET (Cement) ×4 IMPLANT
COMP FEM ATTUNE CRS CEM RT SZ4 (Femur) ×3 IMPLANT
COMPONENT FEM ATN CR CEM RTSZ4 (Femur) IMPLANT
COVER SURGICAL LIGHT HANDLE (MISCELLANEOUS) ×3 IMPLANT
COVER WAND RF STERILE (DRAPES) IMPLANT
CUFF TOURN SGL QUICK 34 (TOURNIQUET CUFF) ×3
CUFF TRNQT CYL 34X4.125X (TOURNIQUET CUFF) ×1 IMPLANT
DECANTER SPIKE VIAL GLASS SM (MISCELLANEOUS) IMPLANT
DERMABOND ADVANCED (GAUZE/BANDAGES/DRESSINGS) ×2
DERMABOND ADVANCED .7 DNX12 (GAUZE/BANDAGES/DRESSINGS) ×1 IMPLANT
DRAPE U-SHAPE 47X51 STRL (DRAPES) ×3 IMPLANT
DRESSING AQUACEL AG SP 3.5X10 (GAUZE/BANDAGES/DRESSINGS) IMPLANT
DRSG AQUACEL AG ADV 3.5X10 (GAUZE/BANDAGES/DRESSINGS) ×3 IMPLANT
DRSG AQUACEL AG SP 3.5X10 (GAUZE/BANDAGES/DRESSINGS) ×3
DURAPREP 26ML APPLICATOR (WOUND CARE) ×6 IMPLANT
ELECT REM PT RETURN 15FT ADLT (MISCELLANEOUS) ×3 IMPLANT
GAUZE SPONGE 2X2 8PLY STRL LF (GAUZE/BANDAGES/DRESSINGS) IMPLANT
GLOVE BIOGEL PI IND STRL 7.5 (GLOVE) ×1 IMPLANT
GLOVE BIOGEL PI IND STRL 8.5 (GLOVE) ×1 IMPLANT
GLOVE BIOGEL PI INDICATOR 7.5 (GLOVE) ×2
GLOVE BIOGEL PI INDICATOR 8.5 (GLOVE) ×2
GLOVE ECLIPSE 8.0 STRL XLNG CF (GLOVE) ×6 IMPLANT
GLOVE ORTHO TXT STRL SZ7.5 (GLOVE) ×3 IMPLANT
GOWN STRL REUS W/TWL 2XL LVL3 (GOWN DISPOSABLE) ×3 IMPLANT
GOWN STRL REUS W/TWL LRG LVL3 (GOWN DISPOSABLE) ×3 IMPLANT
GOWN STRL REUS W/TWL XL LVL3 (GOWN DISPOSABLE) IMPLANT
HANDPIECE INTERPULSE COAX TIP (DISPOSABLE) ×3
HOLDER FOLEY CATH W/STRAP (MISCELLANEOUS) IMPLANT
INSERT TIB CMT ATTUNE RP SZ4 (Knees) ×2 IMPLANT
KIT TURNOVER KIT A (KITS) IMPLANT
MANIFOLD NEPTUNE II (INSTRUMENTS) ×3 IMPLANT
NDL SAFETY ECLIPSE 18X1.5 (NEEDLE) IMPLANT
NEEDLE HYPO 18GX1.5 SHARP (NEEDLE)
NS IRRIG 1000ML POUR BTL (IV SOLUTION) ×3 IMPLANT
PACK TOTAL KNEE CUSTOM (KITS) ×3 IMPLANT
PENCIL SMOKE EVACUATOR (MISCELLANEOUS) IMPLANT
PIN FIX SIGMA LCS THRD HI (PIN) ×2 IMPLANT
PROTECTOR NERVE ULNAR (MISCELLANEOUS) ×3 IMPLANT
RESTRICTOR CEMENT SZ 5 C-STEM (Cement) ×2 IMPLANT
SET HNDPC FAN SPRY TIP SCT (DISPOSABLE) ×1 IMPLANT
SET PAD KNEE POSITIONER (MISCELLANEOUS) ×3 IMPLANT
SLEEVE TIB ATTUNE FP 37 (Knees) ×2 IMPLANT
SPONGE GAUZE 2X2 STER 10/PKG (GAUZE/BANDAGES/DRESSINGS)
STAPLER VISISTAT 35W (STAPLE) IMPLANT
STEM CEMT ATTUNE 14X80 (Knees) ×2 IMPLANT
STEM STR ATTUNE PF 16X60 (Knees) ×2 IMPLANT
SUT MNCRL AB 3-0 PS2 18 (SUTURE) ×3 IMPLANT
SUT STRATAFIX PDS+ 0 24IN (SUTURE) ×3 IMPLANT
SUT VIC AB 1 CT1 36 (SUTURE) ×3 IMPLANT
SUT VIC AB 2-0 CT1 27 (SUTURE) ×9
SUT VIC AB 2-0 CT1 TAPERPNT 27 (SUTURE) ×3 IMPLANT
SYR 50ML LL SCALE MARK (SYRINGE) IMPLANT
TOWER CARTRIDGE SMART MIX (DISPOSABLE) ×3 IMPLANT
TRAY FOLEY MTR SLVR 16FR STAT (SET/KITS/TRAYS/PACK) ×3 IMPLANT
TUBE KAMVAC SUCTION (TUBING) IMPLANT
WATER STERILE IRR 1000ML POUR (IV SOLUTION) ×3 IMPLANT
WRAP KNEE MAXI GEL POST OP (GAUZE/BANDAGES/DRESSINGS) ×3 IMPLANT
YANKAUER SUCT BULB TIP 10FT TU (MISCELLANEOUS) IMPLANT

## 2019-06-11 NOTE — Op Note (Signed)
NAME: [name], [name] MEDICAL RECORD [id number] ACCOUNT [id number] DATE OF BIRTH:[birth year] FACILITY: [facility] LOCATION: [id number] PHYSICIAN:[name] Jafari Marian Sorrow, MD  OPERATIVE REPORT  DATE OF PROCEDURE:  06/11/2019  PREOPERATIVE DIAGNOSIS:  Failed right total knee arthroplasty.  POSTOPERATIVE DIAGNOSIS:  Failed right total knee arthroplasty.  PROCEDURE:  Revision right total knee arthroplasty.  COMPONENTS USED:  DePuy Attune revision knee system with a size 4 revision femur with 4 mm distal augments medially and laterally, a 4 mm posterior medial augment, an 8 mm posterolateral augment, and a 14 x 80 mm cemented stem.  On the tibia side, I used  a size 4 revision tibial tray with a 37 mm press-fit sleeve and a 16 x 60 press-fit stem and we used a 4 x 8 mm insert.  SURGEON:  [name] Cancel, MD  ASSISTANT:  Griffith Citron, PA-C.  Note that [name] was present for the entirety of the case from preoperative position, perioperative management of the operative extremity, general facilitation of the case, and primary wound closure.  ANESTHESIA:  Regional plus spinal block plus LMA.  SPECIMENS:  None.  DRAINS:  None.  ESTIMATED BLOOD LOSS:  Less  than 100 mL.  TOURNIQUET:  Up for 69 minutes at 250 mmHg.  FINDINGS:  Clear synovial fluid upon entry into the knee.  INDICATIONS:  The patient is a pleasant 64 year old female with history of a right total knee arthroplasty within the past 2 years.  She presented to the office for a 2nd opinion evaluation of increasing pain and discomfort in her right knee.   Radiographic workup revealed what appeared to be relatively stable components; however, bone scan indicated increased uptake more than would be expected in the postoperative period.  Due to her pain, dysfunction, and limited quality of life due to her  pain I discussed the sometimes limited information that a bone scan could provide.  However, clinically, she was ready at this point to proceed  with total knee arthroplasty revision.  I reviewed the risk of persistent pain.  Reviewed the risk of  infection, DVT.  We discussed and reviewed the postoperative course.  Consent was obtained for benefit of pain relief.  PROCEDURE DETAIL:  The patient was brought to the operative theater.  Once adequate anesthesia, preoperative antibiotics, Ancef administered as well as tranexamic acid and Decadron, she was positioned supine with a right thigh tourniquet placed.  The  right lower extremity was prepped and draped in sterile fashion.  Her old incision was identified and marked.  I utilized her old incision.  We excised the old scar.  Soft tissue planes created.  Median arthrotomy made.  We encountered clear yellow  synovial fluid.  Initial exposure including synovectomy throughout the knee medial suprapatellar and laterally.  I then evaluated the patella and found the patella was intact.  I removed scarring and synovium around this as well as some lateral bone off  the facet laterally.  The knee was then flexed.  The tibial polyethylene was removed.  I then with retractors placed, used a thin ACL saw and undermined the bone cement interface on the tibia loosening this fairly easily.  Once this was done, I did the  same to the femoral components on the anterior, anterior chamfer, distal aspect of the femur.  The femur came off easily.  The tibia was then subluxated and the tibial component was removed.  We then removed remaining cement off the proximal tibia.  Once  I did this, I used an extramedullary  guide.  The previous tibial component was in a little bit of varus.  We were able to correct with this proximal tibial cut removing some bone laterally as well as remaining cement.  The remaining cement within the  medullary portion of the proximal femur was removed.  Once this was done, I reamed the distal femur and the proximal tibia.  We reamed up to 16 mm for the possibility of press-fit stems were  cemented stems.  I then broached the tibia from a 29 broach to  a 37 broach.  We then placed a size 4 tibial baseplate with a 37 press-fit sleeve and a stem.  This tray set perpendicular to the tibial spine compared to the preoperative varus tibia.  I then evaluated the femur.  A reamer was placed within the femoral  canal.  The distal femoral cut was revisited removing minimal bone.  I then revisited the anterior, posterior and chamfer cuts utilizing the appropriate jig for a size 4 femur.  I did identify that augments were going to be needed posteriorly in order to  provide enough flexion gap stability with this component.  We determined that 8 mm posterolateral augment would be needed and a 4 mm posterior medial.  At this point, we constructed a femoral component without distal augments to do trial reduction.  I  found with the size 7 insert that the knee came into a little bit of hyperextension, but felt good in flexion.  Given these findings, I knew that I was going to need 4 mm distal augments on the femur.  We removed the femoral component, the tibial  components.  We placed a cement restrictor into the distal femur for a cemented femoral component.  The final components were opened on the back table.  They were configured under direct supervision.  The press-fit tibial component was impacted and sat  basically where the trial had.  It may have been sunk in just a little bit more.  Once the femoral component was configured with the 4 mm distal medial and lateral augments as well as the posterior augments, the final femoral component was cemented into  place and the knee was brought to extension with a 7 mm insert.  Extruded cement was removed.  Once the cement fully cured, I ended up selecting the 8 mm insert.  This was placed in the tibia and the knee was reduced.  The prior patellar button tracked  through the trochlea without application of pressure.  The tourniquet was let down after 68 minutes.   No significant hemostasis required.  We irrigated the knee with a total of about 2500 mL normal saline solution with pulse lavage.  The extensor  mechanism was then reapproximated using #1 Vicryl and #1 Stratafix suture.  The remainder of the wound was closed with 2-0 Vicryl and a running Monocryl stitch.  The knee was cleaned, dried and dressed sterilely using surgical glue and Aquacel dressing.   The patient was then brought to the recovery room in stable condition, tolerating the procedure well.  Findings were reviewed with family.  We will get her up, moving around today with hopes and intention to get her home tomorrow.  CN/NUANCE  D:06/11/2019 T:06/11/2019 JOB:[id number]

## 2019-06-11 NOTE — Discharge Instructions (Signed)

## 2019-06-11 NOTE — Progress Notes (Signed)
Assisted Dr. Hatchett with right, ultrasound guided, adductor canal block. Side rails up, monitors on throughout procedure. See vital signs in flow sheet. Tolerated Procedure well.  

## 2019-06-11 NOTE — Interval H&P Note (Signed)
History and Physical Interval Note:  06/11/2019 8:55 AM  [name]  has presented today for surgery, with the diagnosis of Failed right total knee arthroplasty, instability, possible loosening.  The various methods of treatment have been discussed with the patient and family. After consideration of risks, benefits and other options for treatment, the patient has consented to  Procedure(s) with comments: TOTAL KNEE REVISION (Right) - 120 mins as a surgical intervention.  The patient's history has been reviewed, patient examined, no change in status, stable for surgery.  I have reviewed the patient's chart and labs.  Questions were answered to the patient's satisfaction.     Mauri Pole

## 2019-06-11 NOTE — Brief Op Note (Signed)
06/11/2019  10:24 AM  PATIENT:  [name]  64 y.o. 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   Delay start of Pharmacological VTE agent (>24hrs) due to surgical blood loss or risk of bleeding: no

## 2019-06-11 NOTE — Anesthesia Procedure Notes (Signed)
Spinal  Patient location during procedure: OR Staffing Performed: resident/CRNA  Resident/CRNA: [place] Rakeya Glab C, CRNA Preanesthetic Checklist Completed: patient identified, IV checked, site marked, risks and benefits discussed, surgical consent, monitors and equipment checked, pre-op evaluation and timeout performed Spinal Block Patient position: sitting Prep: DuraPrep and site prepped and draped Patient monitoring: heart rate, cardiac monitor, continuous pulse ox and blood pressure Approach: midline Location: L3-4 Injection technique: single-shot Needle Needle type: Pencan  Needle gauge: 24 G Needle length: 9 cm Assessment Sensory level: T4 Additional Notes IV functioning, monitors applied to pt. Expiration date of kit checked and confirmed to be in date. Sterile prep and drape, hand hygiene and sterile gloved used. Pt was positioned and spine was prepped in sterile fashion. Skin was anesthetized with lidocaine. Free flow of clear CSF obtained prior to injecting local anesthetic into CSF x 1 attempt. Unable to aspirate CSF prior to injection, but good free flow of CSF. Needle was carefully withdrawn, and pt tolerated procedure well. Loss of motor and sensory on exam post injection.

## 2019-06-11 NOTE — Anesthesia Procedure Notes (Signed)
Procedure Name: LMA Insertion Date/Time: 06/11/2019 10:46 AM Performed by: [place] Mirissa Lopresti C, CRNA Pre-anesthesia Checklist: Patient identified, Emergency Drugs available, Suction available and Patient being monitored Patient Re-evaluated:Patient Re-evaluated prior to induction Oxygen Delivery Method: Circle system utilized Preoxygenation: Pre-oxygenation with 100% oxygen Induction Type: IV induction Ventilation: Mask ventilation without difficulty LMA: LMA inserted LMA Size: 4.0 Number of attempts: 1 Airway Equipment and Method: Bite block Placement Confirmation: positive ETCO2 Tube secured with: Tape Dental Injury: Teeth and Oropharynx as per pre-operative assessment

## 2019-06-11 NOTE — Anesthesia Preprocedure Evaluation (Signed)
Anesthesia Evaluation  Patient identified by MRN, date of birth, ID band Patient awake    Reviewed: Allergy & Precautions, NPO status , Patient's Chart, lab work & pertinent test results  Airway Mallampati: II       Dental no notable dental hx. (+) Teeth Intact   Pulmonary    Pulmonary exam normal breath sounds clear to auscultation       Cardiovascular negative cardio ROS Normal cardiovascular exam Rhythm:Regular Rate:Normal     Neuro/Psych negative psych ROS   GI/Hepatic negative GI ROS, Neg liver ROS,   Endo/Other  negative endocrine ROS  Renal/GU negative Renal ROS  negative genitourinary   Musculoskeletal   Abdominal (+) + obese,   Peds  Hematology   Anesthesia Other Findings   Reproductive/Obstetrics                             Anesthesia Physical Anesthesia Plan  ASA: II  Anesthesia Plan: Spinal   Post-op Pain Management:  Regional for Post-op pain   Induction:   PONV Risk Score and Plan: Ondansetron and Dexamethasone  Airway Management Planned: Natural Airway, Nasal Cannula and Simple Face Mask  Additional Equipment: None  Intra-op Plan:   Post-operative Plan:   Informed Consent: I have reviewed the patients History and Physical, chart, labs and discussed the procedure including the risks, benefits and alternatives for the proposed anesthesia with the patient or authorized representative who has indicated his/her understanding and acceptance.       Plan Discussed with: CRNA  Anesthesia Plan Comments:         Anesthesia Quick Evaluation

## 2019-06-11 NOTE — Evaluation (Signed)
Physical Therapy Evaluation Patient Details Name: [name] MRN: [id number] DOB: 06/05/1955 Today's Date: 06/11/2019   History of Present Illness  Patient is 64 y.o. 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    Clinical Impression  [name] is a 64 y.o. female POD 0 s/p Rt TKR. Patient reports independence with mobility at baseline. Patient is now limited by functional impairments (see PT problem list below) and requires min-mod assist for bed mobility and transfers with RW. Patient was limited to sit<>stand transfer today due to poor Rt quad activation and decreased ability to reduce weight bearing on Rt LE in standing. Patient also limited by nausea and dizziness with mobility. Patient instructed in exercise to facilitate ROM and circulation. Patient will benefit from continued skilled PT interventions to address impairments and progress towards PLOF. Acute PT will follow to progress mobility and stair training in preparation for safe discharge home.     Follow Up Recommendations Follow surgeon's recommendation for DC plan and follow-up therapies    Equipment Recommendations  Rolling walker with 5" wheels    Recommendations for Other Services       Precautions / Restrictions Precautions Precautions: Fall Restrictions Weight Bearing Restrictions: No      Mobility  Bed Mobility Overal bed mobility: Needs Assistance Bed Mobility: Supine to Sit;Sit to Supine     Supine to sit: Min assist;HOB elevated Sit to supine: Min assist;HOB elevated   General bed mobility comments: cues for reaching to bed rail, pt able to scoot forward to EOB with cues. Assist required for Rt LE mobility.  Transfers Overall transfer level: Needs assistance Equipment used: Rolling walker (2 wheeled) Transfers: Sit to/from Stand Sit to Stand: +2 safety/equipment;+2 physical assistance;Mod assist         General transfer comment: cues  for safe hand placement and technique with RW. assist required for power up and to steady in standing. Pt with poor awareness of needed UE support initially and Rt knee buckling in standing. Pt weight shifting Rt/Lt LE and educated on use of UE for support. Cues to reach back to sit EOB and return to supine due to nausea and dizziness.  Ambulation/Gait       Stairs     Wheelchair Mobility    Modified Rankin (Stroke Patients Only)       Balance Overall balance assessment: Needs assistance Sitting-balance support: Feet supported Sitting balance-Leahy Scale: Good     Standing balance support: During functional activity;Bilateral upper extremity supported Standing balance-Leahy Scale: Poor            Pertinent Vitals/Pain Pain Assessment: 0-10 Pain Score: 7  Pain Location: Rt knee Pain Descriptors / Indicators: Aching;Burning Pain Intervention(s): Limited activity within patient's tolerance;Monitored during session;Repositioned;Ice applied    Home Living Family/patient expects to be discharged to:: Private residence Living Arrangements: Spouse/significant other(daughter visiting) Available Help at Discharge: Family Type of Home: House Home Access: Stairs to enter Entrance Stairs-Rails: Left Entrance Stairs-Number of Steps: 7 in through garage [place]: Two level;Bed/bath upstairs;Able to live on main level with bedroom/bathroom;1/2 bath on main level(sofa and recliner on main) Home Equipment: Bedside commode;Shower seat - built in      Prior Function Level of Independence: Independent         Comments: pt sometimes using scooter at food store due to pain     Hand Dominance   Dominant Hand: Right    Extremity/Trunk Assessment   Upper  Extremity Assessment Upper Extremity Assessment: Overall WFL for tasks assessed    Lower Extremity Assessment Lower Extremity Assessment: Generalized weakness;RLE deficits/detail RLE Deficits / Details: pt with poor quad  activaiton and extensor lag with SLR RLE: Unable to fully assess due to immobilization RLE Sensation: WNL RLE Coordination: decreased gross motor    Cervical / Trunk Assessment Cervical / Trunk Assessment: Normal  Communication   Communication: No difficulties  Cognition Arousal/Alertness: Awake/alert Behavior During Therapy: WFL for tasks assessed/performed Overall Cognitive Status: Within Functional Limits for tasks assessed             General Comments      Exercises Total Joint Exercises Ankle Circles/Pumps: Supine;AROM;Both;20 reps Quad Sets: AROM;Right;10 reps;Supine Heel Slides: AROM;Right;10 reps;Supine;AAROM   Assessment/Plan    PT Assessment Patient needs continued PT services  PT Problem List Decreased strength;Decreased range of motion;Decreased activity tolerance;Decreased balance;Decreased mobility;Decreased knowledge of use of DME;Decreased knowledge of precautions;Obesity       PT Treatment Interventions DME instruction;Gait training;Stair training;Functional mobility training;Therapeutic activities;Therapeutic exercise;Balance training;Patient/family education    PT Goals (Current goals can be found in the Care Plan section)  Acute Rehab PT Goals Patient Stated Goal: to get independent again PT Goal Formulation: With patient Time For Goal Achievement: 06/18/19 Potential to Achieve Goals: Good    Frequency 7X/week    AM-PAC PT "6 Clicks" Mobility  Outcome Measure Help needed turning from your back to your side while in a flat bed without using bedrails?: A Little Help needed moving from lying on your back to sitting on the side of a flat bed without using bedrails?: A Little Help needed moving to and from a bed to a chair (including a wheelchair)?: A Lot Help needed standing up from a chair using your arms (e.g., wheelchair or bedside chair)?: A Lot Help needed to walk in hospital room?: A Lot Help needed climbing 3-5 steps with a railing? : A  Lot 6 Click Score: 14    End of Session Equipment Utilized During Treatment: Gait belt Activity Tolerance: Treatment limited secondary to medical complications (Comment)(nausea, dizziness (BP normal, suspect medications)) Patient left: in bed;with call bell/phone within reach;with bed alarm set;with family/visitor present Nurse Communication: Mobility status PT Visit Diagnosis: Muscle weakness (generalized) (M62.81);Difficulty in walking, not elsewhere classified (R26.2)    Time: [id number] PT Time Calculation (min) (ACUTE ONLY): 26 min   Charges:   PT Evaluation $PT Eval Low Complexity: 1 Low PT Treatments $Therapeutic Exercise: 8-22 mins      Verner Mould, DPT Physical Therapist with [facility] [phone]  06/11/2019 5:35 PM

## 2019-06-11 NOTE — Anesthesia Procedure Notes (Addendum)
Anesthesia Regional Block: Adductor canal block   Pre-Anesthetic Checklist: ,, timeout performed, Correct Patient, Correct Site, Correct Laterality, Correct Procedure, Correct Position, site marked, Risks and benefits discussed,  Surgical consent,  Pre-op evaluation,  At surgeon's request and post-op pain management  Laterality: Lower and Right  Prep: chloraprep       Needles:  Injection technique: Single-shot  Needle Type: Echogenic Stimulator Needle     Needle Length: 10cm  Needle Gauge: 21   Needle insertion depth: 3 cm   Additional Needles:   Procedures:,,,, ultrasound used (permanent image in chart),,,,  Narrative:  Start time: 06/11/2019 9:55 AM End time: 06/11/2019 10:03 AM Injection made incrementally with aspirations every 5 mL.  Performed by: Personally  Anesthesiologist: Lyn Hollingshead, MD

## 2019-06-11 NOTE — Transfer of Care (Signed)
Immediate Anesthesia Transfer of Care Note  Patient: [name]  Procedure(s) Performed: TOTAL KNEE REVISION (Right Knee)  Patient Location: PACU  Anesthesia Type:General  Level of Consciousness: awake and drowsy  Airway & Oxygen Therapy: Patient Spontanous Breathing and Patient connected to face mask oxygen  Post-op Assessment: Report given to RN and Post -op Vital signs reviewed and stable  Post vital signs: Reviewed and stable  Last Vitals:  Vitals Value Taken Time  BP 116/62 06/11/19 1247  Temp 36.4 C 06/11/19 1247  Pulse 87 06/11/19 1300  Resp 20 06/11/19 1300  SpO2 100 % 06/11/19 1300  Vitals shown include unvalidated device data.  Last Pain:  Vitals:   06/11/19 1247  TempSrc:   PainSc: Asleep      Patients Stated Pain Goal: 3 ([id number])  Complications: No apparent anesthesia complications

## 2019-06-11 NOTE — Anesthesia Postprocedure Evaluation (Signed)
Anesthesia Post Note  Patient: [name]  Procedure(s) Performed: TOTAL KNEE REVISION (Right Knee)     Patient location during evaluation: PACU Anesthesia Type: General Level of consciousness: awake and alert Pain management: pain level controlled Vital Signs Assessment: post-procedure vital signs reviewed and stable Respiratory status: spontaneous breathing, nonlabored ventilation, respiratory function stable and patient connected to nasal cannula oxygen Cardiovascular status: blood pressure returned to baseline and stable Postop Assessment: no apparent nausea or vomiting Anesthetic complications: no    Last Vitals:  Vitals:   06/11/19 1400 06/11/19 1418  BP: 112/64 (!) 120/59  Pulse: 82 83  Resp: 13 17  Temp: 36.4 C 36.5 C  SpO2: 100% 100%    Last Pain:  Vitals:   06/11/19 1421  TempSrc:   PainSc: 2                  Effie Berkshire

## 2019-06-12 ENCOUNTER — Encounter: Payer: Self-pay | Admitting: *Deleted

## 2019-06-12 DIAGNOSIS — T84092A Other mechanical complication of internal right knee prosthesis, initial encounter: Secondary | ICD-10-CM | POA: Diagnosis not present

## 2019-06-12 LAB — BASIC METABOLIC PANEL
Anion gap: 6 (ref 5–15)
BUN: 13 mg/dL (ref 8–23)
CO2: 26 mmol/L (ref 22–32)
Calcium: 8.7 mg/dL — ABNORMAL LOW (ref 8.9–10.3)
Chloride: 106 mmol/L (ref 98–111)
Creatinine, Ser: 0.68 mg/dL (ref 0.44–1.00)
GFR calc Af Amer: >60 mL/min (ref >60)
GFR calc non Af Amer: >60 mL/min (ref >60)
Glucose, Bld: 153 mg/dL — ABNORMAL HIGH (ref 70–99)
Potassium: 3.7 mmol/L (ref 3.5–5.1)
Sodium: 138 mmol/L (ref 135–145)

## 2019-06-12 LAB — CBC
HCT: 32.8 % — ABNORMAL LOW (ref 36.0–46.0)
Hemoglobin: 10.2 g/dL — ABNORMAL LOW (ref 12.0–15.0)
MCH: 25.4 pg — ABNORMAL LOW (ref 26.0–34.0)
MCHC: 31.1 g/dL (ref 30.0–36.0)
MCV: 81.6 fL (ref 80.0–100.0)
Platelets: 211 10*3/uL (ref 150–400)
RBC: 4.02 MIL/uL (ref 3.87–5.11)
RDW: 15 % (ref 11.5–15.5)
WBC: 12.1 10*3/uL — ABNORMAL HIGH (ref 4.0–10.5)
nRBC: 0 % (ref 0.0–0.2)

## 2019-06-12 MED ORDER — ASPIRIN 81 MG PO CHEW: 81.0000 mg | CHEWABLE_TABLET | Freq: Two times a day (BID) | ORAL | 0 refills | Status: AC

## 2019-06-12 MED ORDER — FERROUS SULFATE 325 (65 FE) MG PO TABS: 325.0000 mg | ORAL_TABLET | Freq: Two times a day (BID) | ORAL | 3 refills | Status: DC

## 2019-06-12 MED ORDER — CELECOXIB 200 MG PO CAPS: 200.0000 mg | ORAL_CAPSULE | Freq: Two times a day (BID) | ORAL | 0 refills | Status: DC

## 2019-06-12 MED ORDER — METHOCARBAMOL 500 MG PO TABS: 500.0000 mg | ORAL_TABLET | Freq: Four times a day (QID) | ORAL | 0 refills | Status: DC | PRN

## 2019-06-12 MED ORDER — ACETAMINOPHEN 500 MG PO TABS: 1000.0000 mg | ORAL_TABLET | Freq: Three times a day (TID) | ORAL | 0 refills | Status: DC

## 2019-06-12 MED ORDER — OXYCODONE HCL 5 MG PO TABS: 5.0000 mg | ORAL_TABLET | ORAL | 0 refills | Status: DC | PRN

## 2019-06-12 MED ORDER — OXYCODONE HCL 5 MG PO TABS
10.0000 mg | ORAL_TABLET | ORAL | Status: DC | PRN
  Administered 2019-06-12: 10 mg via ORAL
  Filled 2019-06-12 (×2): qty 2

## 2019-06-12 NOTE — TOC Progression Note (Signed)
Transition of Care [facility]) - Progression Note    Patient Details  Name: [name] MRN: [id number] Date of Birth: 1955/02/16  Transition of Care [facility]) CM/SW [place], [place] Phone Number: 06/12/2019, 10:22 AM  Clinical Narrative:    Therapy Plan: OPPT RW and 3 in 1 delivered to bedside.      Barriers to Discharge: Barriers Resolved  Expected Discharge Plan and Services           Expected Discharge Date: 06/12/19               DME Arranged: 3-N-1, Walker rolling DME Agency: [employer] Date DME Agency Contacted: 06/12/19 Time DME Agency Contacted: 0900 Representative spoke with at DME Agency: [place]: NA         Social Determinants of Health ([place]) Interventions    Readmission Risk Interventions No flowsheet data found.

## 2019-06-12 NOTE — Progress Notes (Signed)
Physical Therapy Treatment Patient Details Name: [name] MRN: [id number] DOB: May 29, 1955 Today's Date: 06/12/2019    History of Present Illness Patient is 64 y.o. 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    PT Comments    POD # 1 pm session with spouse Assisted with amb.  General Gait Details: tolerated a functional distance.  C/o "I can't breath" with mask on.  Amb distance limited. General transfer comment: 25% VC's on safety with turns and proper hand placement.  Also assisted with a toilet transfer.General stair comments: with spouse present for "hands on" instruction on proper sequencing and safe handling 7 steps. Then returned to room to perform some TE's following HEP handout.  Instructed on proper tech, freq as well as use of ICE.   Addressed all mobility questions, discussed appropriate activity, educated on use of ICE.  Pt ready for D/C to home.   Follow Up Recommendations  Outpatient PT     Equipment Recommendations  Rolling walker with 5" wheels;3in1 (PT)    Recommendations for Other Services       Precautions / Restrictions Precautions Precautions: Fall Precaution Comments: reviewed no pillow under knee Restrictions Weight Bearing Restrictions: No Other Position/Activity Restrictions: WBAT    Mobility  Bed Mobility     General bed mobility comments: OOB in recliner  Transfers Overall transfer level: Needs assistance Equipment used: Rolling walker (2 wheeled) Transfers: Sit to/from [facility] Sit to Stand: Supervision Stand pivot transfers: Supervision;Min guard       General transfer comment: 25% VC's on safety with turns and proper hand placement.  Also assisted with a toilet transfer.  Ambulation/Gait Ambulation/Gait assistance: Supervision;Min guard Gait Distance (Feet): 28 Feet Assistive device: Rolling walker (2 wheeled) Gait Pattern/deviations: Step-to  pattern;Decreased stance time - right Gait velocity: decreased   General Gait Details: tolerated a functional distance.  C/o "I can't breath" with mask on.  Amb distance limited.   Stairs Stairs: Yes Stairs assistance: Min guard;Min assist Stair Management: One rail Left;Forwards Number of Stairs: 7 General stair comments: with spouse present for "hands on" instruction on proper sequencing and safe handling   Wheelchair Mobility    Modified Rankin (Stroke Patients Only)       Balance                                            Cognition Arousal/Alertness: Awake/alert Behavior During Therapy: WFL for tasks assessed/performed Overall Cognitive Status: Within Functional Limits for tasks assessed                                        Exercises   Total Knee Replacement TE's following HEP handout 10 reps B LE ankle pumps 05 reps towel squeezes 05 reps knee presses 05 reps heel slides  05 reps SAQ's 05 reps SLR's 05 reps ABD Educated on use of gait belt to assist with TE's Followed by ICE     General Comments        Pertinent Vitals/Pain Pain Assessment: 0-10 Pain Score: 3  Pain Location: Rt knee Pain Descriptors / Indicators: Aching;Burning;Tender Pain Intervention(s): Monitored during session;Premedicated before session;Repositioned;Ice applied    Home Living  Prior Function            PT Goals (current goals can now be found in the care plan section) Progress towards PT goals: Progressing toward goals    Frequency    7X/week      PT Plan Current plan remains appropriate    Co-evaluation              AM-PAC PT "6 Clicks" Mobility   Outcome Measure  Help needed turning from your back to your side while in a flat bed without using bedrails?: A Little Help needed moving from lying on your back to sitting on the side of a flat bed without using bedrails?: A Little Help needed  moving to and from a bed to a chair (including a wheelchair)?: A Little Help needed standing up from a chair using your arms (e.g., wheelchair or bedside chair)?: A Little Help needed to walk in hospital room?: A Little Help needed climbing 3-5 steps with a railing? : A Lot 6 Click Score: 17    End of Session Equipment Utilized During Treatment: Gait belt Activity Tolerance: Treatment limited secondary to medical complications (Comment) Patient left: in chair;with call bell/phone within reach Nurse Communication: (pt ready for D/C) PT Visit Diagnosis: Muscle weakness (generalized) (M62.81);Difficulty in walking, not elsewhere classified (R26.2)     Time: 1440-1510 PT Time Calculation (min) (ACUTE ONLY): 30 min  Charges:  $Gait Training: 8-22 mins $Therapeutic Activity: 8-22 mins                     Rica Koyanagi  PTA Acute  Rehabilitation Services Pager      [phone] Office      [phone]

## 2019-06-12 NOTE — Progress Notes (Signed)
   Subjective: 1 Day Post-Op Procedure(s) (LRB): TOTAL KNEE REVISION (Right) Patient reports pain as moderate.   Patient seen in rounds by Dr. Alvan Dame. Patient is well, and has had no acute complaints or problems other than discomfort in the right knee. Her pain was not well controlled yesterday with Norco, so we will switch her to oxycodone today. No acute events overnight. Foley catheter removed, positive flatus.  We will continue therapy today.   Objective: Vital signs in last 24 hours: Temp:  [97.6 F (36.4 C)-98.3 F (36.8 C)] 97.6 F (36.4 C) (04/30 0547) Pulse Rate:  [70-91] 72 (04/30 0547) Resp:  [9-26] 18 (04/30 0547) BP: (112-161)/(52-82) 122/55 (04/30 0547) SpO2:  [95 %-100 %] 99 % (04/30 0547) Weight:  [94.9 kg] 94.9 kg (04/29 0932)  Intake/Output from previous day:  Intake/Output Summary (Last 24 hours) at 06/12/2019 0737 Last data filed at 06/12/2019 0600 Gross per 24 hour  Intake 3277.59 ml  Output 2400 ml  Net 877.59 ml     Intake/Output this shift: No intake/output data recorded.  Labs: Recent Labs    06/12/19 0326  HGB 10.2*   Recent Labs    06/12/19 0326  WBC 12.1*  RBC 4.02  HCT 32.8*  PLT 211   Recent Labs    06/12/19 0326  NA 138  K 3.7  CL 106  CO2 26  BUN 13  CREATININE 0.68  GLUCOSE 153*  CALCIUM 8.7*   No results for input(s): LABPT, INR in the last 72 hours.  Exam: General - Patient is Alert and Oriented Extremity - Neurologically intact Sensation intact distally Intact pulses distally Dorsiflexion/Plantar flexion intact Dressing - dressing C/D/I Motor Function - intact, moving foot and toes well on exam.   Past Medical History:  Diagnosis Date  . Anemia   . Hyperlipidemia   . Pre-diabetes   . Seasonal allergies   . Sleep apnea    Does not use CPAP every night  . SVD (spontaneous vaginal delivery)    x 2    Assessment/Plan: 1 Day Post-Op Procedure(s) (LRB): TOTAL KNEE REVISION (Right) Active Problems:   S/P  right TKA revision  Estimated body mass index is 37.04 kg/m as calculated from the following:   Height as of this encounter: 5\' 3"  (1.6 m).   Weight as of this encounter: 94.9 kg. Advance diet Up with therapy D/C IV fluids  DVT Prophylaxis - Aspirin Weight bearing as tolerated.  Plan is to go Home after hospital stay. Scheduled for OPPT. Plan for discharge today following 1-2 sessions of therapy as long as she is meeting her goals. We have switched her to 5-10 mg oxycodone for pain relief. She will follow up in the office in 2 weeks.   Griffith Citron, PA-C Orthopedic Surgery [phone] 06/12/2019, 7:37 AM

## 2019-06-12 NOTE — Progress Notes (Signed)
Physical Therapy Treatment Patient Details Name: [name] MRN: [id number] DOB: May 20, 1955 Today's Date: 06/12/2019    History of Present Illness Patient is 64 y.o. 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    PT Comments    POD # 1 am session.   General bed mobility comments: demonstrated and instructed how to use a belt to self assist LE.  General transfer comment: 25% VC's on safety with turns and proper hand placement.  Also assisted with a toilet transfer.General Gait Details: assisted with amb to and from bathroom only this morning due to increased fatigue/effort.  Pt statrted to feel "hot". Perform some TE's following HEP handout.  Instructed on proper tech, freq as well as use of ICE.  Pt will need another PT session to complete TE's and practice stairs when spouse arrives.    Follow Up Recommendations  Outpatient PT     Equipment Recommendations  Rolling walker with 5" wheels;3in1 (PT)    Recommendations for Other Services       Precautions / Restrictions Precautions Precautions: Fall Precaution Comments: reviewed no pillow under knee Restrictions Weight Bearing Restrictions: No Other Position/Activity Restrictions: WBAT    Mobility  Bed Mobility Overal bed mobility: Needs Assistance Bed Mobility: Supine to Sit     Supine to sit: Supervision;Min guard     General bed mobility comments: demonstrated and instructed how to use a belt to self assist LE  Transfers Overall transfer level: Needs assistance Equipment used: Rolling walker (2 wheeled) Transfers: Sit to/from [facility] Sit to Stand: Supervision Stand pivot transfers: Supervision;Min guard       General transfer comment: 25% VC's on safety with turns and proper hand placement.  Also assisted with a toilet transfer.  Ambulation/Gait Ambulation/Gait assistance: Supervision;Min guard Gait Distance (Feet): 22 Feet Assistive  device: Rolling walker (2 wheeled) Gait Pattern/deviations: Step-to pattern;Decreased stance time - right Gait velocity: decreased   General Gait Details: assisted with amb to and from bathroom only this morning due to increased fatigue/effort.  Pt statrted to feel "hot".   Stairs             Wheelchair Mobility    Modified Rankin (Stroke Patients Only)       Balance                                            Cognition Arousal/Alertness: Awake/alert Behavior During Therapy: WFL for tasks assessed/performed Overall Cognitive Status: Within Functional Limits for tasks assessed                                        Exercises   Total Knee Replacement TE's following HEP handout 10 reps B LE ankle pumps 05 reps towel squeezes 05 reps knee presses 05 reps heel slides  05 reps SAQ's 05 reps SLR's 05 reps ABD Educated on use of gait belt to assist with TE's Followed by ICE     General Comments        Pertinent Vitals/Pain Pain Assessment: 0-10 Pain Score: 3  Pain Location: Rt knee Pain Descriptors / Indicators: Aching;Burning;Tender Pain Intervention(s): Monitored during session;Premedicated before session;Repositioned;Ice applied    Home Living  Prior Function            PT Goals (current goals can now be found in the care plan section) Progress towards PT goals: Progressing toward goals    Frequency    7X/week      PT Plan Current plan remains appropriate    Co-evaluation              AM-PAC PT "6 Clicks" Mobility   Outcome Measure  Help needed turning from your back to your side while in a flat bed without using bedrails?: A Little Help needed moving from lying on your back to sitting on the side of a flat bed without using bedrails?: A Little Help needed moving to and from a bed to a chair (including a wheelchair)?: A Little Help needed standing up from a chair using  your arms (e.g., wheelchair or bedside chair)?: A Little Help needed to walk in hospital room?: A Little Help needed climbing 3-5 steps with a railing? : A Lot 6 Click Score: 17    End of Session Equipment Utilized During Treatment: Gait belt Activity Tolerance: Treatment limited secondary to medical complications (Comment) Patient left: in chair;with call bell/phone within reach Nurse Communication: Mobility status PT Visit Diagnosis: Muscle weakness (generalized) (M62.81);Difficulty in walking, not elsewhere classified (R26.2)     Time: [id number] PT Time Calculation (min) (ACUTE ONLY): 25 min  Charges:  $Gait Training: 8-22 mins $Therapeutic Exercise: 8-22 mins                     Rica Koyanagi  PTA Acute  Rehabilitation Services Pager      [phone] Office      [phone]

## 2019-06-12 NOTE — Plan of Care (Signed)
  Problem: Health Behavior/Discharge Planning: Goal: Ability to manage health-related needs will improve Outcome: Adequate for Discharge   Problem: Clinical Measurements: Goal: Ability to maintain clinical measurements within normal limits will improve Outcome: Adequate for Discharge Goal: Will remain free from infection Outcome: Adequate for Discharge Goal: Diagnostic test results will improve Outcome: Adequate for Discharge Goal: Respiratory complications will improve Outcome: Adequate for Discharge Goal: Cardiovascular complication will be avoided Outcome: Adequate for Discharge   Problem: Activity: Goal: Risk for activity intolerance will decrease Outcome: Adequate for Discharge   Problem: Nutrition: Goal: Adequate nutrition will be maintained Outcome: Adequate for Discharge   Problem: Elimination: Goal: Will not experience complications related to bowel motility Outcome: Adequate for Discharge Goal: Will not experience complications related to urinary retention Outcome: Adequate for Discharge   Problem: Pain Managment: Goal: General experience of comfort will improve Outcome: Adequate for Discharge   Problem: Safety: Goal: Ability to remain free from injury will improve Outcome: Adequate for Discharge   Problem: Skin Integrity: Goal: Risk for impaired skin integrity will decrease Outcome: Adequate for Discharge   Problem: Education: Goal: Knowledge of the prescribed therapeutic regimen will improve Outcome: Adequate for Discharge   Problem: Activity: Goal: Ability to avoid complications of mobility impairment will improve Outcome: Adequate for Discharge Goal: Range of joint motion will improve Outcome: Adequate for Discharge   Problem: Clinical Measurements: Goal: Postoperative complications will be avoided or minimized Outcome: Adequate for Discharge   Problem: Pain Management: Goal: Pain level will decrease with appropriate interventions Outcome:  Adequate for Discharge   Problem: Skin Integrity: Goal: Will show signs of wound healing Outcome: Adequate for Discharge   

## 2019-06-12 NOTE — Plan of Care (Signed)
  Problem: Activity: Goal: Risk for activity intolerance will decrease Outcome: Progressing   Problem: Nutrition: Goal: Adequate nutrition will be maintained Outcome: Progressing   Problem: Pain Managment: Goal: General experience of comfort will improve Outcome: Progressing   Problem: Education: Goal: Knowledge of the prescribed therapeutic regimen will improve Outcome: Progressing   

## 2019-06-14 NOTE — Discharge Summary (Signed)
Physician Discharge Summary   Patient ID: [name] MRN: [id number] DOB/AGE: 1955-11-08 64 y.o.  Admit date: 06/11/2019 Discharge date: 06/12/2019  Primary Diagnosis:  Failed right total knee arthroplasty  Admission Diagnoses:  Past Medical History:  Diagnosis Date  . Anemia   . Hyperlipidemia   . Pre-diabetes   . Seasonal allergies   . Sleep apnea    Does not use CPAP every night  . SVD (spontaneous vaginal delivery)    x 2   Discharge Diagnoses:   Active Problems:   S/P right TKA revision  Estimated body mass index is 37.04 kg/m as calculated from the following:   Height as of this encounter: 5\' 3"  (1.6 m).   Weight as of this encounter: 94.9 kg.  Procedure:  Procedure(s) (LRB): TOTAL KNEE REVISION (Right)   Consults: None  HPI: The patient is a pleasant 64 year old female with history of a right total knee arthroplasty within the past 2 years.  She presented to the office for a 2nd opinion evaluation of increasing pain and discomfort in her right knee.   Radiographic workup revealed what appeared to be relatively stable components; however, bone scan indicated increased uptake more than would be expected in the postoperative period.  Due to her pain, dysfunction, and limited quality of life due to her  pain I discussed the sometimes limited information that a bone scan could provide.  However, clinically, she was ready at this point to proceed with total knee arthroplasty revision.  I reviewed the risk of persistent pain.  Reviewed the risk of  infection, DVT.  We discussed and reviewed the postoperative course.  Consent was obtained for benefit of pain relief.  Laboratory Data: Admission on 06/11/2019, Discharged on 06/12/2019  Component Date Value Ref Range Status  . WBC 06/12/2019 12.1* 4.0 - 10.5 K/uL Final  . RBC 06/12/2019 4.02  3.87 - 5.11 MIL/uL Final  . Hemoglobin 06/12/2019 10.2* 12.0 - 15.0 g/dL Final  . HCT 06/12/2019 32.8* 36.0 - 46.0 % Final  . MCV  06/12/2019 81.6  80.0 - 100.0 fL Final  . MCH 06/12/2019 25.4* 26.0 - 34.0 pg Final  . MCHC 06/12/2019 31.1  30.0 - 36.0 g/dL Final  . RDW 06/12/2019 15.0  11.5 - 15.5 % Final  . Platelets 06/12/2019 211  150 - 400 K/uL Final  . nRBC 06/12/2019 0.0  0.0 - 0.2 % Final   Performed at [facility], [place] [address], [place], [place] [address]  . Sodium 06/12/2019 138  135 - 145 mmol/L Final  . Potassium 06/12/2019 3.7  3.5 - 5.1 mmol/L Final  . Chloride 06/12/2019 106  98 - 111 mmol/L Final  . CO2 06/12/2019 26  22 - 32 mmol/L Final  . Glucose, Bld 06/12/2019 153* 70 - 99 mg/dL Final   Glucose reference range applies only to samples taken after fasting for at least 8 hours.  . BUN 06/12/2019 13  8 - 23 mg/dL Final  . Creatinine, Ser 06/12/2019 0.68  0.44 - 1.00 mg/dL Final  . Calcium 06/12/2019 8.7* 8.9 - 10.3 mg/dL Final  . GFR calc non Af Amer 06/12/2019 >60  >60 mL/min Final  . GFR calc Af Amer 06/12/2019 >60  >60 mL/min Final  . Anion gap 06/12/2019 6  5 - 15 Final   Performed at [facility], [place] [address] [place], [place] [address]  Hospital Outpatient Visit on 06/08/2019  Component Date Value Ref Range Status  . SARS Coronavirus  2 06/08/2019 NEGATIVE  NEGATIVE Final   Comment: (NOTE) SARS-CoV-2 target nucleic acids are NOT DETECTED. The SARS-CoV-2 RNA is generally detectable in upper and lower respiratory specimens during the acute phase of infection. Negative results do not preclude SARS-CoV-2 infection, do not rule out co-infections with other pathogens, and should not be used as the sole basis for treatment or other patient management decisions. Negative results must be combined with clinical observations, patient history, and epidemiological information. The expected result is Negative. Fact Sheet for Patients: SugarRoll.be Fact Sheet for Healthcare Providers: https://www.woods-mathews.com/ This  test is not yet approved or cleared by the [place] FDA and  has been authorized for detection and/or diagnosis of SARS-CoV-2 by FDA under an Emergency Use Authorization (EUA). This EUA will remain  in effect (meaning this test can be used) for the duration of the COVID-19 declaration under Section 56                          4(b)(1) of the Act, 21 U.S.C. section 360bbb-3(b)(1), unless the authorization is terminated or revoked sooner. Performed at [facility], [place] [address], [place], [place] [address]   Hospital Outpatient Visit on 06/04/2019  Component Date Value Ref Range Status  . MRSA, PCR 06/04/2019 NEGATIVE  NEGATIVE Final  . Staphylococcus aureus 06/04/2019 NEGATIVE  NEGATIVE Final   Comment: (NOTE) The Xpert SA Assay (FDA approved for NASAL specimens in patients 62 years of age and older), is one component of a comprehensive surveillance program. It is not intended to diagnose infection nor to guide or monitor treatment. Performed at [facility], [place] [address] [place], [place] [address]   . ABO/RH(D) 06/04/2019 A POS   Final  . Antibody Screen 06/04/2019 NEG   Final  . Sample Expiration 06/04/2019 06/14/2019,2359   Final  . Extend sample reason 06/04/2019    Final                   Value:NO TRANSFUSIONS OR PREGNANCY IN THE PAST 3 MONTHS Performed at [facility] [address], [place], [place] [address]   . WBC 06/04/2019 6.3  4.0 - 10.5 K/uL Final  . RBC 06/04/2019 4.89  3.87 - 5.11 MIL/uL Final  . Hemoglobin 06/04/2019 12.3  12.0 - 15.0 g/dL Final  . HCT 06/04/2019 40.0  36.0 - 46.0 % Final  . MCV 06/04/2019 81.8  80.0 - 100.0 fL Final  . MCH 06/04/2019 25.2* 26.0 - 34.0 pg Final  . MCHC 06/04/2019 30.8  30.0 - 36.0 g/dL Final  . RDW 06/04/2019 15.3  11.5 - 15.5 % Final  . Platelets 06/04/2019 270  150 - 400 K/uL Final  . nRBC 06/04/2019 0.0  0.0 - 0.2 % Final   Performed at [facility], [place] [address] [place], [place] [address]  . Hgb A1c MFr Bld 06/04/2019 6.3* 4.8 - 5.6 % Final   Comment: (NOTE) Pre diabetes:          5.7%-6.4% Diabetes:              >6.4% Glycemic control for   <7.0% adults with diabetes   . Mean Plasma Glucose 06/04/2019 134.11  mg/dL Final   Performed at [facility] [address], [place], [place] [address]  . ABO/RH(D) 06/04/2019    Final  Value:A POS Performed at [facility], [place] [address] [place], [place] [address]      X-Rays:No results found.  EKG:No orders found for this or any previous visit.   Hospital Course: [name] is a 64 y.o. who was admitted to [facility]. They were brought to the operating room on 06/11/2019 and underwent Procedure(s): TOTAL KNEE REVISION.  Patient tolerated the procedure well and was later transferred to the recovery room and then to the orthopaedic floor for postoperative care. They were given PO and IV analgesics for pain control following their surgery. They were given 24 hours of postoperative antibiotics of  Anti-infectives (From admission, onward)   Start     Dose/Rate Route Frequency Ordered Stop   06/11/19 1600  ceFAZolin (ANCEF) IVPB 2g/100 mL premix     2 g 200 mL/hr over 30 Minutes Intravenous Every 6 hours 06/11/19 1412 06/11/19 2211   06/11/19 0845  ceFAZolin (ANCEF) IVPB 2g/100 mL premix     2 g 200 mL/hr over 30 Minutes Intravenous On call to O.R. 06/11/19 [id number] 06/11/19 1025     and started on DVT prophylaxis in the form of Aspirin.   PT and OT were ordered for total joint protocol. Discharge planning consulted to help with postop disposition and equipment needs.  Patient had a good night on the evening of surgery. They started to get up OOB with therapy on POD #1. Pt was seen during rounds and was ready to go home pending progress with therapy. She worked with therapy on POD #1 and was meeting her goals. Pt was discharged to home later  that day in stable condition.  Diet: Regular diet Activity: WBAT Follow-up: in 2 weeks Disposition: Home Discharged Condition: good   Discharge Instructions    Call MD / Call 911   Complete by: As directed    If you experience chest pain or shortness of breath, CALL 911 and be transported to the hospital emergency room.  If you develope a fever above 101 F, pus (white drainage) or increased drainage or redness at the wound, or calf pain, call your surgeon's office.   Change dressing   Complete by: As directed    Maintain surgical dressing until follow up in the clinic. If the edges start to pull up, may reinforce with tape. If the dressing is no longer working, may remove and cover with gauze and tape, but must keep the area dry and clean.  Call with any questions or concerns.   Constipation Prevention   Complete by: As directed    Drink plenty of fluids.  Prune juice may be helpful.  You may use a stool softener, such as Colace (over the counter) 100 mg twice a day.  Use MiraLax (over the counter) for constipation as needed.   Diet - low sodium heart healthy   Complete by: As directed    Discharge instructions   Complete by: As directed    Maintain surgical dressing until follow up in the clinic. If the edges start to pull up, may reinforce with tape. If the dressing is no longer working, may remove and cover with gauze and tape, but must keep the area dry and clean.  Follow up in 2 weeks at [facility]. Call with any questions or concerns.   Increase activity slowly as tolerated   Complete by: As directed    Weight bearing as tolerated with assist device (walker, cane, etc) as directed, use it as long as  suggested by your surgeon or therapist, typically at least 4-6 weeks.   TED hose   Complete by: As directed    Use stockings (TED hose) for 2 weeks on both leg(s).  You may remove them at night for sleeping.     Allergies as of 06/12/2019      Reactions   Ibuprofen Rash   Naproxen  Sodium Rash      Medication List    STOP taking these medications   Pennsaid 2 % Soln Generic drug: Diclofenac Sodium     TAKE these medications   acetaminophen 500 MG tablet Commonly known as: TYLENOL Take 2 tablets (1,000 mg total) by mouth every 8 (eight) hours.   APPLE CIDER VINEGAR PO Take 1 tablet by mouth daily.   aspirin 81 MG chewable tablet Chew 1 tablet (81 mg total) by mouth 2 (two) times daily for 28 days.   atorvastatin 40 MG tablet Commonly known as: LIPITOR Take 40 mg by mouth at bedtime.   celecoxib 200 MG capsule Commonly known as: CELEBREX Take 1 capsule (200 mg total) by mouth 2 (two) times daily. What changed:   medication strength  how much to take  when to take this   ferrous sulfate 325 (65 FE) MG tablet Take 1 tablet (325 mg total) by mouth 2 (two) times daily with a meal.   fluticasone 50 MCG/ACT nasal spray Commonly known as: FLONASE Place 1 spray into both nostrils daily as needed (allergies.).   gabapentin 100 MG capsule Commonly known as: NEURONTIN Take 200 mg by mouth daily as needed for severe pain.   methocarbamol 500 MG tablet Commonly known as: ROBAXIN Take 1 tablet (500 mg total) by mouth every 6 (six) hours as needed for muscle spasms.   OVER THE COUNTER MEDICATION Take 1 capsule by mouth daily. Selenex [place] Best for Glutathione Production, Immune, Liver & Anti-Aging Support-NAC 600 mg/SelenoExcell Selenium 200 mcg/Red Orange Complex 100 mg   oxyCODONE 5 MG immediate release tablet Commonly known as: Oxy IR/ROXICODONE Take 1-2 tablets (5-10 mg total) by mouth every 4 (four) hours as needed for moderate pain or severe pain.   Transderm-Scop (1.5 MG) 1 MG/3DAYS Generic drug: scopolamine Place 1 patch onto the skin daily as needed (dizziness/motion sickness with travel).   TURMERIC PO Take 1 capsule by mouth daily.   Vitamin D (Ergocalciferol) 1.25 MG (50000 UNIT) Caps capsule Commonly known as: DRISDOL Take 50,000  Units by mouth every Wednesday.   Vitamin D3 1.25 MG (50000 UT) Caps Take 50,000 Units by mouth every Wednesday.            Discharge Care Instructions  (From admission, onward)         Start     Ordered   06/12/19 0000  Change dressing    Comments: Maintain surgical dressing until follow up in the clinic. If the edges start to pull up, may reinforce with tape. If the dressing is no longer working, may remove and cover with gauze and tape, but must keep the area dry and clean.  Call with any questions or concerns.   06/12/19 [id number]         Follow-up Information    Paralee Cancel, MD. Schedule an appointment as soon as possible for a visit in 2 weeks.   Specialty: Orthopedic Surgery Contact information: [address] [place] [address] [id number]           Signed: Griffith Citron, PA-C Orthopedic Surgery 06/14/2019, 8:57 AM

## 2019-12-02 ENCOUNTER — Other Ambulatory Visit: Payer: Self-pay | Admitting: Obstetrics and Gynecology

## 2019-12-02 DIAGNOSIS — Z1231 Encounter for screening mammogram for malignant neoplasm of breast: Secondary | ICD-10-CM

## 2020-01-18 ENCOUNTER — Ambulatory Visit
Admission: RE | Admit: 2020-01-18 | Discharge: 2020-01-18 | Disposition: A | Discharge status: Home / Self Care | Payer: Medicare Other | Source: Ambulatory Visit | Attending: Obstetrics and Gynecology | Admitting: Obstetrics and Gynecology

## 2020-01-18 ENCOUNTER — Other Ambulatory Visit: Payer: Self-pay

## 2020-01-18 ENCOUNTER — Ambulatory Visit: Payer: Medicare Other

## 2020-01-18 DIAGNOSIS — Z1231 Encounter for screening mammogram for malignant neoplasm of breast: Secondary | ICD-10-CM

## 2020-02-08 ENCOUNTER — Other Ambulatory Visit: Payer: Medicare Other

## 2020-02-08 DIAGNOSIS — Z20822 Contact with and (suspected) exposure to covid-19: Secondary | ICD-10-CM

## 2020-02-09 LAB — SARS-COV-2, NAA 2 DAY TAT

## 2020-02-09 LAB — NOVEL CORONAVIRUS, NAA: SARS-CoV-2, NAA: NOT DETECTED

## 2020-08-01 ENCOUNTER — Telehealth: Payer: Self-pay

## 2020-08-01 NOTE — Telephone Encounter (Signed)
NOTES  [place] [phone] SENT REFERRAL TO SCHEDULING

## 2020-08-05 ENCOUNTER — Telehealth: Payer: Self-pay | Admitting: *Deleted

## 2020-08-05 NOTE — Telephone Encounter (Signed)
Notes on file...referral from Marjorie Smolder, MD [phone] sent referral to scheduling

## 2020-08-19 ENCOUNTER — Other Ambulatory Visit (HOSPITAL_COMMUNITY): Payer: Self-pay | Admitting: Gastroenterology

## 2020-08-19 DIAGNOSIS — R1031 Right lower quadrant pain: Secondary | ICD-10-CM

## 2020-08-26 ENCOUNTER — Ambulatory Visit (HOSPITAL_COMMUNITY)
Admission: RE | Admit: 2020-08-26 | Discharge: 2020-08-26 | Disposition: A | Discharge status: Home / Self Care | Payer: Medicare Other | Source: Ambulatory Visit | Attending: Gastroenterology | Admitting: Gastroenterology

## 2020-08-26 ENCOUNTER — Emergency Department (HOSPITAL_COMMUNITY)
Admission: EM | Admit: 2020-08-26 | Discharge: 2020-08-26 | Disposition: A | Discharge status: Home / Self Care | Payer: Medicare Other | Attending: Emergency Medicine | Admitting: Emergency Medicine

## 2020-08-26 ENCOUNTER — Other Ambulatory Visit: Payer: Self-pay

## 2020-08-26 ENCOUNTER — Encounter (HOSPITAL_COMMUNITY): Payer: Self-pay

## 2020-08-26 DIAGNOSIS — T7840XA Allergy, unspecified, initial encounter: Secondary | ICD-10-CM | POA: Diagnosis present

## 2020-08-26 DIAGNOSIS — Z79899 Other long term (current) drug therapy: Secondary | ICD-10-CM | POA: Insufficient documentation

## 2020-08-26 DIAGNOSIS — R1031 Right lower quadrant pain: Secondary | ICD-10-CM | POA: Insufficient documentation

## 2020-08-26 LAB — POCT I-STAT CREATININE: Creatinine, Ser: 0.8 mg/dL (ref 0.44–1.00)

## 2020-08-26 MED ORDER — METHYLPREDNISOLONE SODIUM SUCC 125 MG IJ SOLR
125.0000 mg | Freq: Once | INTRAMUSCULAR | Status: AC
  Administered 2020-08-26: 125 mg via INTRAVENOUS
  Filled 2020-08-26: qty 2

## 2020-08-26 MED ORDER — DIPHENHYDRAMINE HCL 25 MG PO TABS: 25.0000 mg | ORAL_TABLET | Freq: Four times a day (QID) | ORAL | 0 refills | Status: DC | PRN

## 2020-08-26 MED ORDER — PREDNISONE 20 MG PO TABS: 40.0000 mg | ORAL_TABLET | Freq: Every day | ORAL | 0 refills | Status: AC

## 2020-08-26 MED ORDER — IOHEXOL 350 MG/ML SOLN
80.0000 mL | Freq: Once | INTRAVENOUS | Status: AC | PRN
  Administered 2020-08-26: 80 mL via INTRAVENOUS

## 2020-08-26 MED ORDER — FAMOTIDINE 20 MG PO TABS: 20.0000 mg | ORAL_TABLET | Freq: Every day | ORAL | 0 refills | Status: DC

## 2020-08-26 MED ORDER — FAMOTIDINE IN NACL 20-0.9 MG/50ML-% IV SOLN
20.0000 mg | Freq: Once | INTRAVENOUS | Status: AC
  Administered 2020-08-26: 20 mg via INTRAVENOUS
  Filled 2020-08-26: qty 50

## 2020-08-26 MED ORDER — EPINEPHRINE 0.3 MG/0.3ML IJ SOAJ: 0.3000 mg | INTRAMUSCULAR | 0 refills | Status: AC | PRN

## 2020-08-26 NOTE — Discharge Instructions (Addendum)
It was a pleasure taking care of you today.  You were treated for an allergic reaction.  I am sending you home with an EpiPen.  If you use your EpiPen you must return to the ER for further evaluation.  I am also sending home with steroids, Pepcid, and Benadryl.  Take for the next 5 days.  Please follow-up with PCP within the next week.  Return to the ER for any worsening symptoms.

## 2020-08-26 NOTE — ED Triage Notes (Signed)
Pt presents to ED cc allergic reaction. Pt was having a ct performed when she began to have a reaction to the dye. Pt states her throat began to close up. Pt was given Epi and benadryl at cat scan. Pt states feeling much better now. Respirations equal, unlabored. A&ox4.

## 2020-08-26 NOTE — ED Notes (Signed)
Discharged. AVS and medications reviewed.

## 2020-08-26 NOTE — ED Provider Notes (Signed)
[facility] DEPT Provider Note   CSN: [id number] Arrival date & time: 08/26/20  1636     History Chief Complaint  Patient presents with   Allergic Reaction    [name] is a 65 y.o. female with a past medical history significant for hyperlipidemia and prediabetes who presents to the ED due to an anaphylactic reaction.  Patient was having a CT scan performed in the hospital when she felt sudden onset of throat closing sensation and severe headache. She admits to acute shortness of breath. Rapid response team called to CT scanner where she was given epinephrine around 4:20PM and benadryl. Patient admits to resolution in symptoms.  Patient denies rash, abdominal pain, nausea, vomiting.  Denies current difficulties breathing or throat closing sensation.  No previous anaphylactic reactions.  Admits to allergies to ibuprofen and naproxen however, no other known allergies.  Denies any new medications or products.  History obtained from patient and past medical records. No interpreter used during encounter.       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   Trochanteric bursitis 11/16/2015   Lumbosacral radiculitis 03/28/2015   S/P hysterectomy 03/11/2013    Past Surgical History:  Procedure Laterality Date   COLONOSCOPY  2008   DILATION AND CURETTAGE OF UTERUS  04/2011   POLYPECTOMY  05/28/2011   Procedure: POLYPECTOMY;  Surgeon: Avel Sensor, MD;  Location: [facility];  Service: Gynecology;  Laterality: N/A;   ROBOTIC ASSISTED TOTAL HYSTERECTOMY Bilateral 03/11/2013   Procedure: ROBOTIC ASSISTED TOTAL HYSTERECTOMY WITH BILATERAL SALPINGO-OOPHORECTOMY;  Surgeon: Marvene Staff, MD;  Location: [facility];   Service: Gynecology;  Laterality: Bilateral;   SHOULDER ARTHROSCOPY Right 2015   svd     x 2   TONSILLECTOMY     TOTAL KNEE REVISION Right 06/11/2019   Procedure: TOTAL KNEE REVISION;  Surgeon: Paralee Cancel, MD;  Location: [facility];  Service: Orthopedics;  Laterality: Right;  120 mins   TUBAL LIGATION       OB History   No obstetric history on file.     No family history on file.  Social History   Tobacco Use   Smoking status: Never   Smokeless tobacco: Never  Vaping Use   Vaping Use: Never used  Substance Use Topics   Alcohol use: Yes    Comment: wine - weekends   Drug use: No    Home Medications Prior to Admission medications   Medication Sig Start Date End Date Taking? Authorizing Provider  acetaminophen (TYLENOL) 500 MG tablet Take 2 tablets (1,000 mg total) by mouth every 8 (eight) hours. Patient taking differently: Take 1,000 mg by mouth daily as needed for moderate pain. 06/12/19  Yes Irving Copas, PA-C  atorvastatin (LIPITOR) 40 MG tablet Take 40 mg by mouth at bedtime.    Yes [provider]  celecoxib (CELEBREX) 100 MG capsule Take 100 mg by mouth daily. Can take 2 tablets if needed   Yes [provider]  diphenhydrAMINE (BENADRYL) 25 MG tablet Take 1 tablet (25 mg total) by mouth every 6 (six) hours as needed. 08/26/20  Yes Suzy Bouchard, PA-C  EPINEPHrine 0.3 mg/0.3 mL IJ SOAJ injection Inject 0.3 mg into the muscle as needed for anaphylaxis. 08/26/20  Yes Zubayr Bednarczyk, Druscilla Brownie, PA-C  famotidine (PEPCID) 20 MG tablet Take 1 tablet (20 mg total) by mouth daily for 5 days. 08/26/20 08/31/20 Yes Grantley Savage, Druscilla Brownie, PA-C  fluticasone (FLONASE) 50 MCG/ACT nasal spray Place 1 spray into both nostrils daily as needed for allergies (allergies.). 06/04/16  Yes [provider]  linaclotide (LINZESS) 145 MCG CAPS capsule Take 145 mcg by mouth daily as needed (constipation).   Yes [provider]  predniSONE (DELTASONE) 20 MG tablet Take  2 tablets (40 mg total) by mouth daily for 5 days. 08/26/20 08/31/20 Yes Jayron Maqueda, Druscilla Brownie, PA-C  celecoxib (CELEBREX) 200 MG capsule Take 1 capsule (200 mg total) by mouth 2 (two) times daily. Patient not taking: No sig reported 06/12/19   Irving Copas, PA-C  ferrous sulfate 325 (65 FE) MG tablet Take 1 tablet (325 mg total) by mouth 2 (two) times daily with a meal. Patient not taking: No sig reported 06/12/19   Irving Copas, PA-C  methocarbamol (ROBAXIN) 500 MG tablet Take 1 tablet (500 mg total) by mouth every 6 (six) hours as needed for muscle spasms. Patient not taking: No sig reported 06/12/19   Irving Copas, PA-C  oxyCODONE (OXY IR/ROXICODONE) 5 MG immediate release tablet Take 1-2 tablets (5-10 mg total) by mouth every 4 (four) hours as needed for moderate pain or severe pain. Patient not taking: No sig reported 06/12/19   Irving Copas, PA-C    Allergies    Contrast media [iodinated diagnostic agents], Ibuprofen, and Naproxen sodium  Review of Systems   Review of Systems  Respiratory:  Positive for shortness of breath (resolved).   Gastrointestinal:  Negative for abdominal pain, nausea and vomiting.  Skin:  Negative for rash.  Neurological:  Positive for headaches (resolved).  All other systems reviewed and are negative.  Physical Exam Updated Vital Signs BP (!) 148/86   Pulse 85   Temp 98.5 F (36.9 C) (Oral)   Resp 18   Ht 5\' 3"  (1.6 m)   Wt 93 kg   SpO2 100%   BMI 36.31 kg/m   Physical Exam Vitals and nursing note reviewed.  Constitutional:      General: She is not in acute distress.    Appearance: She is not ill-appearing.  HENT:     Head: Normocephalic.     Mouth/Throat:     Comments: Airway patent. Eyes:     Pupils: Pupils are equal, round, and reactive to light.  Cardiovascular:     Rate and Rhythm: Normal rate and regular rhythm.     Pulses: Normal pulses.     Heart sounds: Normal heart sounds. No murmur heard.   No friction rub. No  gallop.  Pulmonary:     Effort: Pulmonary effort is normal.     Breath sounds: Normal breath sounds.     Comments: Respirations equal and unlabored, patient able to speak in full sentences, lungs clear to auscultation bilaterally. No stridor or wheeze. Abdominal:     General: Abdomen is flat. There is no distension.     Palpations: Abdomen is soft.     Tenderness: There is no abdominal tenderness. There is no guarding or rebound.     Comments: Abdomen soft, nondistended, nontender to palpation in all quadrants without guarding or peritoneal signs. No rebound.   Musculoskeletal:        General: Normal  range of motion.     Cervical back: Neck supple.  Skin:    General: Skin is warm and dry.  Neurological:     General: No focal deficit present.     Mental Status: She is alert.  Psychiatric:        Mood and Affect: Mood normal.        Behavior: Behavior normal.    ED Results / Procedures / Treatments   Labs (all labs ordered are listed, but only abnormal results are displayed) Labs Reviewed - No data to display  EKG None  Radiology No results found.  Procedures Procedures   Medications Ordered in ED Medications  famotidine (PEPCID) IVPB 20 mg premix (20 mg Intravenous New Bag/Given 08/26/20 1716)  methylPREDNISolone sodium succinate (SOLU-MEDROL) 125 mg/2 mL injection 125 mg (125 mg Intravenous Given 08/26/20 1714)    ED Course  I have reviewed the triage vital signs and the nursing notes.  Pertinent labs & imaging results that were available during my care of the patient were reviewed by me and considered in my medical decision making (see chart for details).    MDM Rules/Calculators/A&P                         65 year old female presents to the ED due to possible anaphylactic reaction from IV contrast dye while undergoing CT in the hospital.  No previous anaphylactic reactions.  Patient given epinephrine and Benadryl prior to arrival.  Patient admits to resolution in  symptoms.  Upon arrival, stable vitals.  Patient in no acute distress.  No rash.  Abdomen soft, nondistended, nontender.  Airway patent.  Lungs clear to auscultation bilaterally without stridor or wheeze.  Will observe for 4 hours after epinephrine. IV pepcid and solu-medrol given.   5:42 PM reassessed patient at bedside.  No difficulties breathing.  Lungs clear to auscultation bilaterally without wheeze or stridor. Airway patent. Will continue to observe.  6:54 PM reassessed patient at bedside.  Patient resting comfortably in bed.  No breathing difficulties.  Lungs clear to auscultation bilaterally without wheeze or stridor.  8:55 PM reassessed patient at bedside.  Lungs clear to auscultation bilaterally without stridor or wheeze.  Patient stable for discharge.  Patient observed in the ED for 4 hours after epinephrine.  Patient discharged with Pepcid and prednisone. Strict ED precautions discussed with patient. Patient states understanding and agrees to plan. Patient discharged home in no acute distress and stable vitals  Final Clinical Impression(s) / ED Diagnoses Final diagnoses:  Allergic reaction, initial encounter    Rx / DC Orders ED Discharge Orders          Ordered    predniSONE (DELTASONE) 20 MG tablet  Daily        08/26/20 2058    famotidine (PEPCID) 20 MG tablet  Daily        08/26/20 2058    diphenhydrAMINE (BENADRYL) 25 MG tablet  Every 6 hours PRN        08/26/20 2058    EPINEPHrine 0.3 mg/0.3 mL IJ SOAJ injection  As needed        08/26/20 2058             Karie Kirks 08/26/20 2100    Sherwood Gambler, MD 08/30/20 [phone]

## 2020-09-01 ENCOUNTER — Emergency Department (HOSPITAL_BASED_OUTPATIENT_CLINIC_OR_DEPARTMENT_OTHER)
Admission: EM | Admit: 2020-09-01 | Discharge: 2020-09-01 | Disposition: A | Discharge status: Home / Self Care | Payer: Medicare Other | Attending: Emergency Medicine | Admitting: Emergency Medicine

## 2020-09-01 ENCOUNTER — Other Ambulatory Visit: Payer: Self-pay

## 2020-09-01 ENCOUNTER — Encounter (HOSPITAL_BASED_OUTPATIENT_CLINIC_OR_DEPARTMENT_OTHER): Payer: Self-pay | Admitting: *Deleted

## 2020-09-01 DIAGNOSIS — H6691 Otitis media, unspecified, right ear: Secondary | ICD-10-CM | POA: Insufficient documentation

## 2020-09-01 DIAGNOSIS — H9201 Otalgia, right ear: Secondary | ICD-10-CM | POA: Diagnosis present

## 2020-09-01 MED ORDER — CIPROFLOXACIN-DEXAMETHASONE 0.3-0.1 % OT SUSP: 4.0000 [drp] | Freq: Two times a day (BID) | OTIC | 0 refills | Status: AC

## 2020-09-01 NOTE — ED Provider Notes (Signed)
[facility] EMERGENCY DEPARTMENT Provider Note   CSN: [id number] Arrival date & time: 09/01/20  1729     History Chief Complaint  Patient presents with   Ear Problem    [name] is a 65 y.o. female.  The history is provided by the patient.  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   Trochanteric bursitis 11/16/2015   Lumbosacral radiculitis 03/28/2015   S/P hysterectomy 03/11/2013    Past Surgical History:  Procedure Laterality Date   COLONOSCOPY  2008   DILATION AND CURETTAGE OF UTERUS  04/2011   POLYPECTOMY  05/28/2011   Procedure: POLYPECTOMY;  Surgeon: Avel Sensor, MD;  Location: [facility];  Service: Gynecology;  Laterality: N/A;   ROBOTIC ASSISTED TOTAL HYSTERECTOMY Bilateral 03/11/2013   Procedure: ROBOTIC ASSISTED TOTAL HYSTERECTOMY WITH BILATERAL SALPINGO-OOPHORECTOMY;  Surgeon: Marvene Staff, MD;  Location: [facility];  Service: Gynecology;  Laterality: Bilateral;   SHOULDER ARTHROSCOPY Right 2015   svd     x 2   TONSILLECTOMY     TOTAL KNEE REVISION Right 06/11/2019   Procedure: TOTAL KNEE REVISION;  Surgeon: Paralee Cancel, MD;  Location: [facility];  Service: Orthopedics;  Laterality: Right;  120 mins   TUBAL LIGATION       OB History   No obstetric history on file.     No family history on file.  Social History   Tobacco Use   Smoking status: Never    Smokeless tobacco: Never  Vaping Use   Vaping Use: Never used  Substance Use Topics   Alcohol use: Yes    Comment: wine - weekends   Drug use: No    Home Medications Prior to Admission medications   Medication Sig Start Date End Date Taking? Authorizing Provider  ciprofloxacin-dexamethasone (CIPRODEX) OTIC suspension Place 4 drops into the right ear 2 (two) times daily for 7 days. 09/01/20 09/08/20 Yes Eyonna Sandstrom, DO  acetaminophen (TYLENOL) 500 MG tablet Take 2 tablets (1,000 mg total) by mouth every 8 (eight) hours. Patient taking differently: Take 1,000 mg by mouth daily as needed for moderate pain. 06/12/19   Irving Copas, PA-C  atorvastatin (LIPITOR) 40 MG tablet Take 40 mg by mouth at bedtime.     [provider]  celecoxib (CELEBREX) 100 MG capsule Take 100 mg by mouth daily. Can take 2 tablets if needed    [provider]  celecoxib (CELEBREX) 200 MG capsule Take 1 capsule (200 mg total) by mouth 2 (two) times daily. Patient not taking: No sig reported 06/12/19   Irving Copas, PA-C  diphenhydrAMINE (BENADRYL) 25 MG tablet Take 1 tablet (25 mg total) by mouth every 6 (six) hours as needed. 08/26/20   Suzy Bouchard, PA-C  EPINEPHrine 0.3 mg/0.3 mL IJ SOAJ injection Inject 0.3  mg into the muscle as needed for anaphylaxis. 08/26/20   Suzy Bouchard, PA-C  famotidine (PEPCID) 20 MG tablet Take 1 tablet (20 mg total) by mouth daily for 5 days. 08/26/20 08/31/20  Suzy Bouchard, PA-C  ferrous sulfate 325 (65 FE) MG tablet Take 1 tablet (325 mg total) by mouth 2 (two) times daily with a meal. Patient not taking: No sig reported 06/12/19   Irving Copas, PA-C  fluticasone (FLONASE) 50 MCG/ACT nasal spray Place 1 spray into both nostrils daily as needed for allergies (allergies.). 06/04/16   [provider]  linaclotide Rolan Lipa) 145 MCG CAPS capsule Take 145 mcg by mouth daily as needed (constipation).    [provider]   methocarbamol (ROBAXIN) 500 MG tablet Take 1 tablet (500 mg total) by mouth every 6 (six) hours as needed for muscle spasms. Patient not taking: No sig reported 06/12/19   Irving Copas, PA-C  oxyCODONE (OXY IR/ROXICODONE) 5 MG immediate release tablet Take 1-2 tablets (5-10 mg total) by mouth every 4 (four) hours as needed for moderate pain or severe pain. Patient not taking: No sig reported 06/12/19   Irving Copas, PA-C    Allergies    Contrast media [iodinated diagnostic agents], Ibuprofen, and Naproxen sodium  Review of Systems   Review of Systems  Constitutional:  Negative for fever.  HENT:  Positive for ear pain. Negative for congestion, hearing loss, rhinorrhea, sore throat and tinnitus.    Physical Exam Updated Vital Signs BP (!) 161/70 (BP Location: Right Arm)   Pulse 84   Temp 98.3 F (36.8 C) (Oral)   Resp 18   Ht 5\' 3"  (1.6 m)   Wt 94.3 kg   SpO2 97%   BMI 36.85 kg/m   Physical Exam Constitutional:      General: She is not in acute distress.    Appearance: She is not ill-appearing.  HENT:     Head: Normocephalic.     Right Ear: Ear canal and external ear normal. There is no impacted cerumen.     Left Ear: Ear canal and external ear normal. There is no impacted cerumen.     Ears:     Comments: Some mild bulging of the right TM but no erythema, no TM perforation bilaterally, left TM is normal    Nose: Nose normal.     Mouth/Throat:     Mouth: Mucous membranes are moist.  Eyes:     Pupils: Pupils are equal, round, and reactive to light.  Neurological:     Mental Status: She is alert.    ED Results / Procedures / Treatments   Labs (all labs ordered are listed, but only abnormal results are displayed) Labs Reviewed - No data to display  EKG None  Radiology No results found.  Procedures Procedures   Medications Ordered in ED Medications - No data to display  ED Course  I have reviewed the triage vital signs and the nursing  notes.  Pertinent labs & imaging results that were available during my care of the patient were reviewed by me and considered in my medical decision making (see chart for details).    MDM Rules/Calculators/A&P                           [name] is here with right ear pain.  May be some mild bulging of the right TM.  Will prescribe Ciprodex.  Could be allergy  related.  Otherwise ear exam is unremarkable.  Discharged in good condition.  This chart was dictated using voice recognition software.  Despite best efforts to proofread,  errors can occur which can change the documentation meaning.   Final Clinical Impression(s) / ED Diagnoses Final diagnoses:  Ear pain, right    Rx / DC Orders ED Discharge Orders          Ordered    ciprofloxacin-dexamethasone (CIPRODEX) OTIC suspension  2 times daily        09/01/20 1817             Lennice Sites, DO 09/01/20 1819

## 2020-09-01 NOTE — ED Triage Notes (Signed)
Pain in her right ear for at least 2 weeks.

## 2020-10-12 NOTE — Progress Notes (Signed)
Cardiology Office Note:    Date:  10/14/2020   ID:  [name], DOB 1955/06/12, MRN [id number]  PCP:  Colonel Bald, MD  Cardiologist:  None   Referring MD: Colonel Bald, MD   Chief Complaint  Patient presents with   Abnormal ECG   Follow-up    Family history CAD     History of Present Illness:    [name] is a 65 y.o. female with a hx of diabetes mellitus type 2, hyperlipidemia, OSA, CKD 3, referred for evaluation of shortness of breath.  She is the daughter of [name].  Her father and Mrs. [name] both have atherosclerosis.  This [name] is interested in whether or not there is any underlying significant vascular disease.  Friends and others tell her that when she walks she breathes really heavy.  She does not feel short of breath and does not feel limited.  She does have obstructive sleep apnea, prediabetes, hyperlipidemia.  Past Medical History:  Diagnosis Date   Anemia    Hyperlipidemia    Pre-diabetes    Seasonal allergies    Sleep apnea    Does not use CPAP every night   SVD (spontaneous vaginal delivery)    x 2    Past Surgical History:  Procedure Laterality Date   COLONOSCOPY  2008   DILATION AND CURETTAGE OF UTERUS  04/2011   POLYPECTOMY  05/28/2011   Procedure: POLYPECTOMY;  Surgeon: Avel Sensor, MD;  Location: [facility];  Service: Gynecology;  Laterality: N/A;   ROBOTIC ASSISTED TOTAL HYSTERECTOMY Bilateral 03/11/2013   Procedure: ROBOTIC ASSISTED TOTAL HYSTERECTOMY WITH BILATERAL SALPINGO-OOPHORECTOMY;  Surgeon: Marvene Staff, MD;  Location: [facility];  Service: Gynecology;  Laterality: Bilateral;   SHOULDER ARTHROSCOPY Right 2015   svd     x 2   TONSILLECTOMY     TOTAL KNEE REVISION Right 06/11/2019   Procedure: TOTAL KNEE REVISION;  Surgeon: Paralee Cancel, MD;  Location: [facility];  Service: Orthopedics;  Laterality: Right;  120 mins   TUBAL LIGATION      Current Medications: Current Meds  Medication Sig    acetaminophen (TYLENOL) 500 MG tablet Take 2 tablets (1,000 mg total) by mouth every 8 (eight) hours. (Patient taking differently: Take 1,000 mg by mouth daily as needed for moderate pain.)   atorvastatin (LIPITOR) 40 MG tablet Take 40 mg by mouth at bedtime.    celecoxib (CELEBREX) 100 MG capsule Take 100 mg by mouth daily. Can take 2 tablets if needed   EPINEPHrine 0.3 mg/0.3 mL IJ SOAJ injection Inject 0.3 mg into the muscle as needed for anaphylaxis.   fluticasone (FLONASE) 50 MCG/ACT nasal spray Place 1 spray into both nostrils daily as needed for allergies (allergies.).   linaclotide (LINZESS) 145 MCG CAPS capsule Take 145 mcg by mouth daily as needed (constipation).   methocarbamol (ROBAXIN) 500 MG tablet Take 1 tablet (500 mg total) by mouth every 6 (six) hours as needed for muscle spasms.   oxyCODONE (OXY IR/ROXICODONE) 5 MG immediate release tablet Take 1-2 tablets (5-10 mg total) by mouth every 4 (four) hours as needed for moderate pain or severe pain.     Allergies:   Contrast media [iodinated diagnostic agents], Ibuprofen, and Naproxen sodium   Social History   Socioeconomic History   Marital status: Married    Spouse name: Not on file   Number of children: Not on file   Years of education: Not on file   Highest  education level: Not on file  Occupational History   Not on file  Tobacco Use   Smoking status: Never   Smokeless tobacco: Never  Vaping Use   Vaping Use: Never used  Substance and Sexual Activity   Alcohol use: Yes    Comment: wine - weekends   Drug use: No   Sexual activity: Yes    Birth control/protection: Surgical  Other Topics Concern   Not on file  Social History Narrative   Not on file   Social Determinants of Health   Financial Resource Strain: Not on file  Food Insecurity: Not on file  Transportation Needs: Not on file  Physical Activity: Not on file  Stress: Not on file  Social Connections: Not on file     Family History: The patient's  family history includes Diabetes in her mother; Heart disease in her father; Hyperlipidemia in her mother; Hypertension in her father.  ROS:   Please see the history of present illness.    Sleeps with CPAP every night.  Does not feel limited in her lifestyle.  Does have occasional lower extremity swelling.  She pushes back on the diagnosis of diabetes and states that she is prediabetic.  All other systems reviewed and are negative.  EKGs/Labs/Other Studies Reviewed:    The following studies were reviewed today: Laboratory data June 2022: LDL 95, TC 161, TG 81 Creatinine 0.9 Hemoglobin A1c 6.4  EKG:  EKG sinus rhythm, relatively short PR, left atrial abnormality, nonspecific T wave abnormality precordial.  Recent Labs: 08/26/2020: Creatinine, Ser 0.80  Recent Lipid Panel    Component Value Date/Time   CHOL (H) 04/02/2008 0435    219        ATP III CLASSIFICATION:  <200     mg/dL   Desirable  200-239  mg/dL   Borderline High  >=240    mg/dL   High          TRIG 93 04/02/2008 0435   HDL 50 04/02/2008 0435   CHOLHDL 4.4 04/02/2008 0435   VLDL 19 04/02/2008 0435   LDLCALC (H) 04/02/2008 0435    150        Total Cholesterol/HDL:CHD Risk Coronary Heart Disease Risk Table                     Men   Women  1/2 Average Risk   3.4   3.3  Average Risk       5.0   4.4  2 X Average Risk   9.6   7.1  3 X Average Risk  23.4   11.0        Use the calculated Patient Ratio above and the CHD Risk Table to determine the patient's CHD Risk.        ATP III CLASSIFICATION (LDL):  <100     mg/dL   Optimal  100-129  mg/dL   Near or Above                    Optimal  130-159  mg/dL   Borderline  160-189  mg/dL   High  >190     mg/dL   Very High    Physical Exam:    VS:  BP 124/72   Pulse 80   Ht '5\' 3"'$  (1.6 m)   Wt 212 lb 12.8 oz (96.5 kg)   SpO2 98%   BMI 37.70 kg/m     Wt Readings from Last 3 Encounters:  10/14/20  212 lb 12.8 oz (96.5 kg)  09/01/20 208 lb (94.3 kg)  08/26/20  205 lb (93 kg)     GEN: Obese. No acute distress HEENT: Normal NECK: No JVD. LYMPHATICS: No lymphadenopathy CARDIAC: No murmur. RRR S4 gallop, or edema. VASCULAR:  Normal Pulses. No bruits. RESPIRATORY:  Clear to auscultation without rales, wheezing or rhonchi  ABDOMEN: Soft, non-tender, non-distended, No pulsatile mass, MUSCULOSKELETAL: No deformity  SKIN: Warm and dry NEUROLOGIC:  Alert and oriented x 3 PSYCHIATRIC:  Normal affect   ASSESSMENT:    1. DOE (dyspnea on exertion)   2. Abnormal electrocardiogram (ECG) (EKG)   3. Hyperlipidemia, unspecified hyperlipidemia type   4. Prediabetes   5. Obstructive sleep apnea    PLAN:    In order of problems listed above:  Loud breathing when she walks.  Patient does not feel particularly short of breath but others are concerned.  2D Doppler echocardiogram to evaluate for evidence of systolic or diastolic dysfunction.  May also help explain ST-T wave abnormalities on EKG. EKG demonstrates generalized T wave flattening and inversion.  Rule out ischemic heart disease.  Echocardiogram to look for wall motion abnormality. Continue high intensity statin therapy.  Most recent LDL cholesterol was 95.  A coronary calcium score will be done.  If significant calcification, much more aggressive lipid-lowering will be necessary.  Also based upon the calcium score, ischemic evaluation may become necessary. A1c is less than 6.5. Compliance with CPAP is advocated.     Overall education and awareness concerning primary risk prevention was discussed in detail: LDL less than 70, hemoglobin A1c less than 7, blood pressure target less than 130/80 mmHg, >150 minutes of moderate aerobic activity per week, avoidance of smoking, weight control (via diet and exercise), and continued surveillance/management of/for obstructive sleep apnea.    Medication Adjustments/Labs and Tests Ordered: Current medicines are reviewed at length with the patient today.   Concerns regarding medicines are outlined above.  Orders Placed This Encounter  Procedures   CT CARDIAC SCORING (SELF PAY ONLY)   EKG 12-Lead   ECHOCARDIOGRAM COMPLETE    No orders of the defined types were placed in this encounter.   Patient Instructions  Medication Instructions:  Your physician recommends that you continue on your current medications as directed. Please refer to the Current Medication list given to you today.  *If you need a refill on your cardiac medications before your next appointment, please call your pharmacy*   Lab Work: None If you have labs (blood work) drawn today and your tests are completely normal, you will receive your results only by: [place] (if you have MyChart) OR A paper copy in the mail If you have any lab test that is abnormal or we need to change your treatment, we will call you to review the results.   Testing/Procedures: Your physician has requested that you have an echocardiogram. Echocardiography is a painless test that uses sound waves to create images of your heart. It provides your doctor with information about the size and shape of your heart and how well your heart's chambers and valves are working. This procedure takes approximately one hour. There are no restrictions for this procedure.  Your physician recommends that you have a Calcium Score performed.   Follow-Up: At [facility], you and your health needs are our priority.  As part of our continuing mission to provide you with exceptional heart care, we have created designated Provider Care Teams.  These Care Teams include your primary  Cardiologist (physician) and Advanced Practice Providers (APPs -  Physician Assistants and Nurse Practitioners) who all work together to provide you with the care you need, when you need it.  We recommend signing up for the patient portal called "MyChart".  Sign up information is provided on this After Visit Summary.  MyChart is used to  connect with patients for Virtual Visits (Telemedicine).  Patients are able to view lab/test results, encounter notes, upcoming appointments, etc.  Non-urgent messages can be sent to your provider as well.   To learn more about what you can do with MyChart, go to [email or web address].    Your next appointment:   As needed  The format for your next appointment:   In Person  Provider:   You may see Daneen Schick, MD or one of the following Advanced Practice Providers on your designated Care Team:   Cecilie Kicks, NP   Other Instructions     Signed, Sinclair Grooms, MD  10/14/2020 11:45 AM    [place]

## 2020-10-14 ENCOUNTER — Other Ambulatory Visit: Payer: Self-pay

## 2020-10-14 ENCOUNTER — Encounter: Payer: Self-pay | Admitting: Interventional Cardiology

## 2020-10-14 ENCOUNTER — Ambulatory Visit (INDEPENDENT_AMBULATORY_CARE_PROVIDER_SITE_OTHER): Payer: Medicare Other | Admitting: Interventional Cardiology

## 2020-10-14 VITALS — BP 124/72 | HR 80 | Ht 63.0 in | Wt 212.8 lb

## 2020-10-14 DIAGNOSIS — E785 Hyperlipidemia, unspecified: Secondary | ICD-10-CM

## 2020-10-14 DIAGNOSIS — R7303 Prediabetes: Secondary | ICD-10-CM

## 2020-10-14 DIAGNOSIS — R9431 Abnormal electrocardiogram [ECG] [EKG]: Secondary | ICD-10-CM

## 2020-10-14 DIAGNOSIS — R0609 Other forms of dyspnea: Secondary | ICD-10-CM

## 2020-10-14 DIAGNOSIS — R0600 Dyspnea, unspecified: Secondary | ICD-10-CM | POA: Diagnosis not present

## 2020-10-14 DIAGNOSIS — G4733 Obstructive sleep apnea (adult) (pediatric): Secondary | ICD-10-CM

## 2020-10-14 NOTE — Patient Instructions (Signed)
Medication Instructions:  Your physician recommends that you continue on your current medications as directed. Please refer to the Current Medication list given to you today.  *If you need a refill on your cardiac medications before your next appointment, please call your pharmacy*   Lab Work: None If you have labs (blood work) drawn today and your tests are completely normal, you will receive your results only by: [place] (if you have MyChart) OR A paper copy in the mail If you have any lab test that is abnormal or we need to change your treatment, we will call you to review the results.   Testing/Procedures: Your physician has requested that you have an echocardiogram. Echocardiography is a painless test that uses sound waves to create images of your heart. It provides your doctor with information about the size and shape of your heart and how well your heart's chambers and valves are working. This procedure takes approximately one hour. There are no restrictions for this procedure.  Your physician recommends that you have a Calcium Score performed.   Follow-Up: At [facility], you and your health needs are our priority.  As part of our continuing mission to provide you with exceptional heart care, we have created designated Provider Care Teams.  These Care Teams include your primary Cardiologist (physician) and Advanced Practice Providers (APPs -  Physician Assistants and Nurse Practitioners) who all work together to provide you with the care you need, when you need it.  We recommend signing up for the patient portal called "MyChart".  Sign up information is provided on this After Visit Summary.  MyChart is used to connect with patients for Virtual Visits (Telemedicine).  Patients are able to view lab/test results, encounter notes, upcoming appointments, etc.  Non-urgent messages can be sent to your provider as well.   To learn more about what you can do with MyChart, go to  [email or web address].    Your next appointment:   As needed  The format for your next appointment:   In Person  Provider:   You may see Daneen Schick, MD or one of the following Advanced Practice Providers on your designated Care Team:   Cecilie Kicks, NP   Other Instructions

## 2020-11-07 ENCOUNTER — Ambulatory Visit (HOSPITAL_COMMUNITY): Payer: Medicare Other | Attending: Cardiology

## 2020-11-07 ENCOUNTER — Other Ambulatory Visit: Payer: Self-pay

## 2020-11-07 ENCOUNTER — Ambulatory Visit (INDEPENDENT_AMBULATORY_CARE_PROVIDER_SITE_OTHER)
Admission: RE | Admit: 2020-11-07 | Discharge: 2020-11-07 | Disposition: A | Discharge status: Home / Self Care | Payer: Self-pay | Source: Ambulatory Visit | Attending: Interventional Cardiology | Admitting: Interventional Cardiology

## 2020-11-07 DIAGNOSIS — R9431 Abnormal electrocardiogram [ECG] [EKG]: Secondary | ICD-10-CM | POA: Insufficient documentation

## 2020-11-07 DIAGNOSIS — R0609 Other forms of dyspnea: Secondary | ICD-10-CM

## 2020-11-07 DIAGNOSIS — R0600 Dyspnea, unspecified: Secondary | ICD-10-CM

## 2020-11-07 DIAGNOSIS — E785 Hyperlipidemia, unspecified: Secondary | ICD-10-CM

## 2020-11-07 LAB — ECHOCARDIOGRAM COMPLETE
Area-P 1/2: 3.17 cm2
MV M vel: 5.01 m/s
MV Peak grad: 100.4 mmHg
Radius: 0.47 cm
S' Lateral: 2.8 cm

## 2020-12-19 ENCOUNTER — Other Ambulatory Visit: Payer: Self-pay | Admitting: Obstetrics and Gynecology

## 2020-12-19 DIAGNOSIS — Z1231 Encounter for screening mammogram for malignant neoplasm of breast: Secondary | ICD-10-CM

## 2021-01-20 ENCOUNTER — Ambulatory Visit
Admission: RE | Admit: 2021-01-20 | Discharge: 2021-01-20 | Disposition: A | Discharge status: Home / Self Care | Payer: Medicare Other | Source: Ambulatory Visit | Attending: Obstetrics and Gynecology | Admitting: Obstetrics and Gynecology

## 2021-01-20 DIAGNOSIS — Z1231 Encounter for screening mammogram for malignant neoplasm of breast: Secondary | ICD-10-CM

## 2021-12-19 ENCOUNTER — Other Ambulatory Visit: Payer: Self-pay | Admitting: Obstetrics and Gynecology

## 2021-12-19 DIAGNOSIS — Z1231 Encounter for screening mammogram for malignant neoplasm of breast: Secondary | ICD-10-CM

## 2022-01-26 ENCOUNTER — Ambulatory Visit
Admission: RE | Admit: 2022-01-26 | Discharge: 2022-01-26 | Disposition: A | Discharge status: Home / Self Care | Payer: Medicare Other | Source: Ambulatory Visit | Attending: Obstetrics and Gynecology | Admitting: Obstetrics and Gynecology

## 2022-01-26 DIAGNOSIS — Z1231 Encounter for screening mammogram for malignant neoplasm of breast: Secondary | ICD-10-CM

## 2022-01-30 ENCOUNTER — Other Ambulatory Visit: Payer: Self-pay | Admitting: Obstetrics and Gynecology

## 2022-01-30 DIAGNOSIS — R928 Other abnormal and inconclusive findings on diagnostic imaging of breast: Secondary | ICD-10-CM

## 2022-02-14 ENCOUNTER — Ambulatory Visit
Admission: RE | Admit: 2022-02-14 | Discharge: 2022-02-14 | Disposition: A | Discharge status: Home / Self Care | Payer: Medicare Other | Source: Ambulatory Visit | Attending: Obstetrics and Gynecology | Admitting: Obstetrics and Gynecology

## 2022-02-14 ENCOUNTER — Other Ambulatory Visit: Payer: Self-pay | Admitting: Obstetrics and Gynecology

## 2022-02-14 DIAGNOSIS — R928 Other abnormal and inconclusive findings on diagnostic imaging of breast: Secondary | ICD-10-CM

## 2022-02-14 DIAGNOSIS — N6310 Unspecified lump in the right breast, unspecified quadrant: Secondary | ICD-10-CM

## 2022-02-16 ENCOUNTER — Ambulatory Visit: Payer: Medicare Other

## 2022-04-25 ENCOUNTER — Ambulatory Visit (HOSPITAL_BASED_OUTPATIENT_CLINIC_OR_DEPARTMENT_OTHER): Payer: Medicare Other | Admitting: Cardiology

## 2022-05-11 ENCOUNTER — Ambulatory Visit: Payer: Medicare Other

## 2022-05-11 ENCOUNTER — Ambulatory Visit
Admission: RE | Admit: 2022-05-11 | Discharge: 2022-05-11 | Disposition: A | Discharge status: Home / Self Care | Payer: Medicare Other | Source: Ambulatory Visit | Attending: Obstetrics and Gynecology | Admitting: Obstetrics and Gynecology

## 2022-05-11 DIAGNOSIS — R928 Other abnormal and inconclusive findings on diagnostic imaging of breast: Secondary | ICD-10-CM

## 2022-05-11 DIAGNOSIS — N6310 Unspecified lump in the right breast, unspecified quadrant: Secondary | ICD-10-CM

## 2022-08-13 ENCOUNTER — Ambulatory Visit: Payer: Medicare Other | Admitting: Cardiology

## 2022-08-21 NOTE — Progress Notes (Deleted)
Cardiology Office Note:   Date:  08/21/2022  ID:  [name], DOB [birth year], MRN [id number]  History of Present Illness:   [name] is a 67 y.o. female with history of DMII, HLD, OSA, and CKD III who presents to clinic for follow-up.  Patient initially seen by Dr. Katrinka Blazing in 10/2020 for SOB. TTE 10/2020 with EF 60-65%, normal diastolic function, no significant valve disease. Ca score 0.  Today, ***  Past Medical History:  Diagnosis Date   Anemia    Hyperlipidemia    Pre-diabetes    Seasonal allergies    Sleep apnea    Does not use CPAP every night   SVD (spontaneous vaginal delivery)    x 2     ROS: ***  Studies Reviewed:    EKG:  ***  Cardiac Studies & Procedures       ECHOCARDIOGRAM  ECHOCARDIOGRAM COMPLETE 11/07/2020  Narrative ECHOCARDIOGRAM REPORT    Patient Name:   [name] Date of Exam: 11/07/2020 Medical Rec #:  [id number]      Height:       63.0 in Accession #:    [id number]     Weight:       212.8 lb Date of Birth:  November 22, 1955      BSA:          1.985 m Patient Age:    65 years       BP:           161/72 mmHg Patient Gender: F              HR:           76 bpm. Exam Location:  [address]  Procedure: 2D Echo  Indications:    R06.00 SOB  History:        Patient has no prior history of Echocardiogram examinations. Abnormal ECG; Risk Factors:Diabetes, Sleep Apnea and HLD, CKD.  Sonographer:    Clearence Ped RCS Referring Phys: [id number] Barry Dienes [facility]  IMPRESSIONS   1. Left ventricular ejection fraction, by estimation, is 60 to 65%. The left ventricle has normal function. The left ventricle has no regional wall motion abnormalities. Left ventricular diastolic parameters were normal. 2. Right ventricular systolic function is normal. The right ventricular size is normal. There is normal pulmonary artery systolic pressure. The estimated right ventricular systolic pressure is 25.8 mmHg. 3. The mitral valve is normal in structure. No evidence of  mitral valve regurgitation. No evidence of mitral stenosis. 4. The aortic valve was not well visualized. Aortic valve regurgitation is not visualized. Mild aortic valve sclerosis is present, with no evidence of aortic valve stenosis. 5. The inferior vena cava is normal in size with greater than 50% respiratory variability, suggesting right atrial pressure of 3 mmHg.  FINDINGS Left Ventricle: Left ventricular ejection fraction, by estimation, is 60 to 65%. The left ventricle has normal function. The left ventricle has no regional wall motion abnormalities. The left ventricular internal cavity size was normal in size. There is no left ventricular hypertrophy. Left ventricular diastolic parameters were normal. Normal left ventricular filling pressure.  Right Ventricle: The right ventricular size is normal. No increase in right ventricular wall thickness. Right ventricular systolic function is normal. There is normal pulmonary artery systolic pressure. The tricuspid regurgitant velocity is 2.39 m/s, and with an assumed right atrial pressure of 3 mmHg, the estimated right ventricular systolic pressure is 25.8 mmHg.  Left Atrium: Left atrial size was normal in size.  Right Atrium: Right atrial size was normal in size.  Pericardium: There is no evidence of pericardial effusion.  Mitral Valve: The mitral valve is normal in structure. No evidence of mitral valve regurgitation. No evidence of mitral valve stenosis.  Tricuspid Valve: The tricuspid valve is normal in structure. Tricuspid valve regurgitation is trivial. No evidence of tricuspid stenosis.  Aortic Valve: The aortic valve was not well visualized. Aortic valve regurgitation is not visualized. Mild aortic valve sclerosis is present, with no evidence of aortic valve stenosis.  Pulmonic Valve: The pulmonic valve was normal in structure. Pulmonic valve regurgitation is not visualized. No evidence of pulmonic stenosis.  Aorta: The aortic root is  normal in size and structure.  Venous: The inferior vena cava is normal in size with greater than 50% respiratory variability, suggesting right atrial pressure of 3 mmHg.  IAS/Shunts: No atrial level shunt detected by color flow Doppler.   LEFT VENTRICLE PLAX 2D LVIDd:         4.20 cm  Diastology LVIDs:         2.80 cm  LV e' medial:    7.94 cm/s LV PW:         1.00 cm  LV E/e' medial:  11.5 LV IVS:        0.80 cm  LV e' lateral:   9.68 cm/s LVOT diam:     1.70 cm  LV E/e' lateral: 9.4 LV SV:         48 LV SV Index:   24 LVOT Area:     2.27 cm   RIGHT VENTRICLE RV Basal diam:  2.70 cm RV S prime:     10.10 cm/s TAPSE (M-mode): 2.2 cm RVSP:           25.8 mmHg  LEFT ATRIUM             Index       RIGHT ATRIUM           Index LA diam:        3.20 cm 1.61 cm/m  RA Pressure: 3.00 mmHg LA Vol (A2C):   33.1 ml 16.67 ml/m RA Area:     9.88 cm LA Vol (A4C):   33.9 ml 17.08 ml/m RA Volume:   18.10 ml  9.12 ml/m LA Biplane Vol: 34.4 ml 17.33 ml/m AORTIC VALVE LVOT Vmax:   105.00 cm/s LVOT Vmean:  69.200 cm/s LVOT VTI:    0.211 m  AORTA Ao Root diam: 2.90 cm Ao Asc diam:  3.00 cm  MITRAL VALVE                 TRICUSPID VALVE MV Area (PHT):               TR Peak grad:   22.8 mmHg MV Decel Time:               TR Vmax:        239.00 cm/s MR Peak grad:    100.4 mmHg  Estimated RAP:  3.00 mmHg MR Mean grad:    75.0 mmHg   RVSP:           25.8 mmHg MR Vmax:         501.00 cm/s MR Vmean:        423.0 cm/s  SHUNTS MR PISA:         1.37 cm    Systemic VTI:  0.21 m MR PISA Eff ROA: 8 mm  Systemic Diam: 1.70 cm MR PISA Radius:  0.47 cm MV E velocity: 91.20 cm/s MV A velocity: 106.00 cm/s MV E/A ratio:  0.86  Armanda Magic MD Electronically signed by Armanda Magic MD Signature Date/Time: 11/07/2020/2:21:03 PM    Final     CT SCANS  CT CARDIAC SCORING (SELF PAY ONLY) 11/07/2020  Addendum 11/07/2020  3:14 PM ADDENDUM REPORT: 11/07/2020 15:11  CLINICAL DATA:  Risk  stratification: 67 Year-old African American Female  EXAM: Coronary Calcium Score  TECHNIQUE: The patient was scanned on a [employer] scanner. Axial non-contrast 3 mm slices were carried out through the heart. The data set was analyzed on a dedicated work station and scored using the Agatson method.  FINDINGS: Non-cardiac: See separate report from [facility].  Ascending Aorta: Normal caliber.  Pericardium: Normal.  Coronary arteries: Normal origins.  Coronary Calcium Score:  Left main: 0  Left anterior descending artery: 0  Left circumflex artery: 0  Right coronary artery: 0  Total: 0  Percentile: 1st for age, sex, and race matched control.  IMPRESSION: 1. Coronary calcium score of 0. This was 1st percentile for age, gender, and race matched controls.  RECOMMENDATIONS:  Coronary artery calcium (CAC) score is a strong predictor of incident coronary heart disease (CHD) and provides predictive information beyond traditional risk factors. CAC scoring is reasonable to use in the decision to withhold, postpone, or initiate statin therapy in intermediate-risk or selected borderline-risk asymptomatic adults (age 45-75 years and LDL-C >=70 to <190 mg/dL) who do not have diabetes or established atherosclerotic cardiovascular disease (ASCVD).* In intermediate-risk (10-year ASCVD risk >=7.5% to <20%) adults or selected borderline-risk (10-year ASCVD risk >=5% to <7.5%) adults in whom a CAC score is measured for the purpose of making a treatment decision the following recommendations have been made:  If CAC = 0, it is reasonable to withhold statin therapy and reassess in 5 to 10 years, as long as higher risk conditions are absent (diabetes mellitus, family history of premature CHD in first degree relatives (males <55 years; females <65 years), cigarette smoking, LDL >=190 mg/dL or other independent risk factors).  If CAC is 1 to 99, it is reasonable to  initiate statin therapy for patients >=34 years of age.  If CAC is >=100 or >=75th percentile, it is reasonable to initiate statin therapy at any age.  Cardiology referral should be considered for patients with CAC scores =400 or >=75th percentile.  *2018 AHA/ACC/AACVPR/AAPA/ABC/ACPM/ADA/AGS/APhA/ASPC/NLA/PCNA Guideline on the Management of Blood Cholesterol: A Report of the American College of Cardiology/American Heart Association Task Force on Clinical Practice Guidelines. J Am Coll Cardiol. 2019;73(24):3168-3209.  Riley Lam, MD   Electronically Signed By: Riley Lam M.D. On: 11/07/2020 15:11  Narrative EXAM: OVER-READ INTERPRETATION  CT CHEST  The following report is an over-read performed by radiologist Dr. Trudie Reed of [facility] on 11/07/2020. This over-read does not include interpretation of cardiac or coronary anatomy or pathology. The coronary calcium score/coronary CTA interpretation by the cardiologist is attached.  COMPARISON:  None.  FINDINGS: Within the visualized portions of the thorax there are no suspicious appearing pulmonary nodules or masses, there is no acute consolidative airspace disease, no pleural effusions, no pneumothorax and no lymphadenopathy. Visualized portions of the upper abdomen are unremarkable. There are no aggressive appearing lytic or blastic lesions noted in the visualized portions of the skeleton.  IMPRESSION: No significant incidental noncardiac findings are noted.  Electronically Signed: By: Trudie Reed M.D. On: 11/07/2020 14:03  Risk Assessment/Calculations:   {Does this patient have ATRIAL FIBRILLATION?[phone]} No BP recorded.  {Refresh Note OR Click here to enter BP  :1}***        Physical Exam:   VS:  There were no vitals taken for this visit.   Wt Readings from Last 3 Encounters:  10/14/20 212 lb 12.8 oz (96.5 kg)  09/01/20 208 lb (94.3 kg)   08/26/20 205 lb (93 kg)     GEN: Well nourished, well developed in no acute distress NECK: No JVD; No carotid bruits CARDIAC: ***RRR, no murmurs, rubs, gallops RESPIRATORY:  Clear to auscultation without rales, wheezing or rhonchi  ABDOMEN: Soft, non-tender, non-distended EXTREMITIES:  No edema; No deformity   ASSESSMENT AND PLAN:   #DOE: -TTE with EF 60-65%, no significant valve disease -Ca score 0 -Currently, ***  #HLD: -Continue lipitor 40mg  daily -Ca score 0    {Are you ordering a CV Procedure (e.g. stress test, cath, DCCV, TEE, etc)?   Press F2        :[id number]}   Signed, Meriam Sprague, MD

## 2022-08-23 ENCOUNTER — Ambulatory Visit (HOSPITAL_BASED_OUTPATIENT_CLINIC_OR_DEPARTMENT_OTHER): Payer: Medicare Other | Admitting: Cardiology

## 2022-09-25 ENCOUNTER — Emergency Department (HOSPITAL_COMMUNITY)
Admission: EM | Admit: 2022-09-25 | Discharge: 2022-09-25 | Disposition: A | Discharge status: Home / Self Care | Payer: Medicare Other | Attending: Emergency Medicine | Admitting: Emergency Medicine

## 2022-09-25 ENCOUNTER — Other Ambulatory Visit: Payer: Self-pay

## 2022-09-25 ENCOUNTER — Encounter (HOSPITAL_COMMUNITY): Payer: Self-pay

## 2022-09-25 DIAGNOSIS — R300 Dysuria: Secondary | ICD-10-CM | POA: Insufficient documentation

## 2022-09-25 DIAGNOSIS — R309 Painful micturition, unspecified: Secondary | ICD-10-CM | POA: Diagnosis present

## 2022-09-25 LAB — URINALYSIS, ROUTINE W REFLEX MICROSCOPIC
Bilirubin Urine: NEGATIVE
Glucose, UA: NEGATIVE mg/dL
Hgb urine dipstick: NEGATIVE
Ketones, ur: NEGATIVE mg/dL
Leukocytes,Ua: NEGATIVE
Nitrite: NEGATIVE
Protein, ur: NEGATIVE mg/dL
Specific Gravity, Urine: 1.01 (ref 1.005–1.030)
pH: 8 (ref 5.0–8.0)

## 2022-09-25 NOTE — Discharge Instructions (Signed)
1.  Return if you get a fever, back pain or other concerning symptoms. 2.  At this time your urine does not show signs of infection. 3.  Sometimes there are other causes of urinary discomfort.  If this is persisting and you have already been treated with several rounds of antibiotics, you should be seen by urologist for checkup.  Contact information for [facility] is included in your discharge instructions.

## 2022-09-25 NOTE — ED Provider Notes (Signed)
[place] EMERGENCY DEPARTMENT AT [facility] Provider Note   CSN: [id number] Arrival date & time: 09/25/22  1943     History  Chief Complaint  Patient presents with   Urinary Tract Infection    [name] is a 67 y.o. female.  HPI Patient reports for about a month she was having problems with discomfort with urination.  She reports she was treated with 2 rounds of antibiotics.  First she was treated with ciprofloxacin and then she was treated with a second round of antibiotics was a combination of Flagyl and Bactrim.  Patient reports that symptoms are mostly resolved but she still has a bit of a tingling discomfort sensation with urination although not frank burning or urgency.  No fevers no chills no back pain.  Patient reports she is treated at [facility].  Patient reports she is sexually active.  She denies vaginal drainage or discharge.  She reports she has history of total hysterectomy.  She no longer sees a gynecologist.    Home Medications Prior to Admission medications   Medication Sig Start Date End Date Taking? Authorizing Provider  acetaminophen (TYLENOL) 500 MG tablet Take 2 tablets (1,000 mg total) by mouth every 8 (eight) hours. Patient taking differently: Take 1,000 mg by mouth daily as needed for moderate pain. 06/12/19   Cassandria Anger, PA-C  atorvastatin (LIPITOR) 40 MG tablet Take 40 mg by mouth at bedtime.     [provider]  celecoxib (CELEBREX) 100 MG capsule Take 100 mg by mouth daily. Can take 2 tablets if needed    [provider]  celecoxib (CELEBREX) 200 MG capsule Take 1 capsule (200 mg total) by mouth 2 (two) times daily. Patient not taking: Reported on 10/14/2020 06/12/19   Cassandria Anger, PA-C  diphenhydrAMINE (BENADRYL) 25 MG tablet Take 1 tablet (25 mg total) by mouth every 6 (six) hours as needed. Patient not taking: Reported on 10/14/2020 08/26/20   Mannie Stabile, PA-C  EPINEPHrine 0.3 mg/0.3 mL IJ SOAJ  injection Inject 0.3 mg into the muscle as needed for anaphylaxis. 08/26/20   Mannie Stabile, PA-C  famotidine (PEPCID) 20 MG tablet Take 1 tablet (20 mg total) by mouth daily for 5 days. 08/26/20 08/31/20  Mannie Stabile, PA-C  ferrous sulfate 325 (65 FE) MG tablet Take 1 tablet (325 mg total) by mouth 2 (two) times daily with a meal. Patient not taking: Reported on 10/14/2020 06/12/19   Cassandria Anger, PA-C  fluticasone [facility]) 50 MCG/ACT nasal spray Place 1 spray into both nostrils daily as needed for allergies (allergies.). 06/04/16   [provider]  linaclotide Karlene Einstein) 145 MCG CAPS capsule Take 145 mcg by mouth daily as needed (constipation).    [provider]  methocarbamol (ROBAXIN) 500 MG tablet Take 1 tablet (500 mg total) by mouth every 6 (six) hours as needed for muscle spasms. 06/12/19   Cassandria Anger, PA-C  oxyCODONE (OXY IR/ROXICODONE) 5 MG immediate release tablet Take 1-2 tablets (5-10 mg total) by mouth every 4 (four) hours as needed for moderate pain or severe pain. 06/12/19   Cassandria Anger, PA-C  Vitamin D, Ergocalciferol, (DRISDOL) 1.25 MG (50000 UNIT) CAPS capsule Take 50,000 Units by mouth every 7 (seven) days. Patient not taking: Reported on 10/14/2020    [provider]      Allergies    Contrast media [iodinated contrast media], Ibuprofen, and Naproxen sodium    Review of Systems  Review of Systems  Physical Exam Updated Vital Signs BP (!) 170/78 (BP Location: Left Arm)   Pulse 84   Temp 98 F (36.7 C) (Oral)   Resp 18   Ht 5' 2.5" (1.588 m)   Wt 85.3 kg   SpO2 100%   BMI 33.84 kg/m  Physical Exam Constitutional:      Comments: Alert nontoxic well-nourished well-developed.  HENT:     Mouth/Throat:     Pharynx: Oropharynx is clear.  Cardiovascular:     Rate and Rhythm: Normal rate and regular rhythm.  Pulmonary:     Effort: Pulmonary effort is normal.     Breath sounds: Normal breath sounds.  Abdominal:      General: There is no distension.     Palpations: Abdomen is soft.     Tenderness: There is no abdominal tenderness. There is no guarding.  Musculoskeletal:        General: Normal range of motion.  Skin:    General: Skin is warm and dry.  Neurological:     General: No focal deficit present.     Mental Status: She is oriented to person, place, and time.     ED Results / Procedures / Treatments   Labs (all labs ordered are listed, but only abnormal results are displayed) Labs Reviewed  URINALYSIS, ROUTINE W REFLEX MICROSCOPIC - Abnormal; Notable for the following components:      Result Value   Color, Urine STRAW (*)    All other components within normal limits    EKG None  Radiology No results found.  Procedures Procedures    Medications Ordered in ED Medications - No data to display  ED Course/ Medical Decision Making/ A&P                                 Medical Decision Making Amount and/or Complexity of Data Reviewed Labs: ordered.   Patient presents as outlined.  She went to make sure her urine had cleared.  She still has some mild symptoms of dysuria.  Patient is clinically well without fever chills or flank pain.  Urinalysis negative.  Patient has low risk for PID.  She has had complete hysterectomy.  She denies vaginal discharge.  At this time my recommendation is to wait several more days to a week to see if symptoms resolve as urinalysis is normal.  If she has persisting of symptoms may be consistent with interstitial cystitis.  Patient instructed on return precautions.  Voices understanding.        Final Clinical Impression(s) / ED Diagnoses Final diagnoses:  Dysuria    Rx / DC Orders ED Discharge Orders     None         Arby Barrette, MD 09/25/22 2210

## 2022-09-25 NOTE — ED Triage Notes (Signed)
Pt states that she has had a uti for a month and the medications are not working.

## 2022-10-01 NOTE — Progress Notes (Unsigned)
Cardiology Office Note:    Date:  10/02/2022   ID:  [name], DOB [birth year], MRN [id number]  PCP:  Charlette Caffey, MD  Cardiologist:  None   Referring MD: Charlette Caffey, MD   CC: Transition to new cardiologist  History of Present Illness:    [name] is a 67 y.o. female with a hx of diabetes mellitus type 2, hyperlipidemia, OSA, CKD 3, referred for evaluation of shortness of breath in 2022 Katrinka Blazing). Her Mother was his patient.   Patient notes that she is doing fine.   Since last visit notes that she works out at [employer] twice a week. Has a trainer and see her two days a week.  With this her breathlessness has resolved. Weight loss has occurred. Eats yogurt and peachs for breakfast, one slice or bacon and an [place] Muffin. Does takeout 2-3 times a week. Husband cooks.  He didn't want to be on metformin and lots 40 lbs. Likes seafood and steak.   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 Hoping for grandchildren. There are no interval hospital/ED visit.   EKG shows SR.  No chest pain or pressure .  No SOB/DOE and no PND/Orthopnea.  No weight gain or leg swelling.  No palpitations or syncope .  Past Medical History:  Diagnosis Date   Anemia    Hyperlipidemia    Pre-diabetes    Seasonal allergies    Sleep apnea    Does not use CPAP every night   SVD (spontaneous vaginal delivery)    x 2    Past Surgical History:  Procedure Laterality Date   COLONOSCOPY  2008   DILATION AND CURETTAGE OF UTERUS  04/2011   POLYPECTOMY  05/28/2011   Procedure: POLYPECTOMY;  Surgeon: Fortino Sic, MD;  Location: [facility];  Service: Gynecology;  Laterality: N/A;   ROBOTIC ASSISTED TOTAL HYSTERECTOMY Bilateral 03/11/2013   Procedure: ROBOTIC ASSISTED TOTAL HYSTERECTOMY WITH BILATERAL SALPINGO-OOPHORECTOMY;  Surgeon: Serita Kyle, MD;  Location: [facility];  Service: Gynecology;  Laterality: Bilateral;   SHOULDER ARTHROSCOPY Right 2015   svd     x 2    TONSILLECTOMY     TOTAL KNEE REVISION Right 06/11/2019   Procedure: TOTAL KNEE REVISION;  Surgeon: Durene Romans, MD;  Location: [facility];  Service: Orthopedics;  Laterality: Right;  120 mins   TUBAL LIGATION      Current Medications: Current Meds  Medication Sig   acetaminophen (TYLENOL) 500 MG tablet Take 2 tablets (1,000 mg total) by mouth every 8 (eight) hours. (Patient taking differently: Take 1,000 mg by mouth daily as needed for moderate pain.)   Ascorbic Acid (VITAMIN C) 1000 MG tablet Take 1,000 mg by mouth daily.   atorvastatin (LIPITOR) 40 MG tablet Take 40 mg by mouth at bedtime.    celecoxib (CELEBREX) 100 MG capsule Take 100 mg by mouth daily. Can take 2 tablets if needed   Cholecalciferol (VITAMIN D3) 25 MCG (1000 UT) CAPS Take by mouth. 1 tab daily   diphenhydrAMINE (BENADRYL) 25 MG tablet Take 1 tablet (25 mg total) by mouth every 6 (six) hours as needed.   EPINEPHrine 0.3 mg/0.3 mL IJ SOAJ injection Inject 0.3 mg into the muscle as needed for anaphylaxis.   fluticasone (FLONASE) 50 MCG/ACT nasal spray Place 1 spray into both nostrils daily as needed for allergies (allergies.).   Loratadine (CLARITIN PO) Take by mouth. daily   OZEMPIC, 1 MG/DOSE, 4 MG/3ML SOPN Inject 1 mg into the  skin once a week.     Allergies:   Contrast media [iodinated contrast media], Ibuprofen, and Naproxen sodium   Social History   Socioeconomic History   Marital status: Married    Spouse name: Not on file   Number of children: Not on file   Years of education: Not on file   Highest education level: Not on file  Occupational History   Not on file  Tobacco Use   Smoking status: Never   Smokeless tobacco: Never  Vaping Use   Vaping status: Never Used  Substance and Sexual Activity   Alcohol use: Yes    Comment: wine - weekends   Drug use: No   Sexual activity: Yes    Birth control/protection: Surgical  Other Topics Concern   Not on file  Social History Narrative   Not on file    Social Determinants of Health   Financial Resource Strain: Not on file  Food Insecurity: Not on file  Transportation Needs: Not on file  Physical Activity: Not on file  Stress: Not on file  Social Connections: Not on file     Family History: The patient's family history includes Diabetes in her mother; Heart disease in her father; Hyperlipidemia in her mother; Hypertension in her father.  ROS:   Please see the history of present illness.     EKGs/Labs/Other Studies Reviewed:    Cardiac Studies & Procedures       ECHOCARDIOGRAM  ECHOCARDIOGRAM COMPLETE 11/07/2020  Narrative ECHOCARDIOGRAM REPORT    Patient Name:   [name] Date of Exam: 11/07/2020 Medical Rec #:  [id number]      Height:       63.0 in Accession #:    [id number]     Weight:       212.8 lb Date of Birth:  1955-11-22      BSA:          1.985 m Patient Age:    65 years       BP:           161/72 mmHg Patient Gender: F              HR:           76 bpm. Exam Location:  [address]  Procedure: 2D Echo  Indications:    R06.00 SOB  History:        Patient has no prior history of Echocardiogram examinations. Abnormal ECG; Risk Factors:Diabetes, Sleep Apnea and HLD, CKD.  Sonographer:    Clearence Ped RCS Referring Phys: [id number] [facility]  IMPRESSIONS   1. Left ventricular ejection fraction, by estimation, is 60 to 65%. The left ventricle has normal function. The left ventricle has no regional wall motion abnormalities. Left ventricular diastolic parameters were normal. 2. Right ventricular systolic function is normal. The right ventricular size is normal. There is normal pulmonary artery systolic pressure. The estimated right ventricular systolic pressure is 25.8 mmHg. 3. The mitral valve is normal in structure. No evidence of mitral valve regurgitation. No evidence of mitral stenosis. 4. The aortic valve was not well visualized. Aortic valve regurgitation is not visualized. Mild aortic valve  sclerosis is present, with no evidence of aortic valve stenosis. 5. The inferior vena cava is normal in size with greater than 50% respiratory variability, suggesting right atrial pressure of 3 mmHg.  FINDINGS Left Ventricle: Left ventricular ejection fraction, by estimation, is 60 to 65%. The left ventricle has normal function. The left ventricle  has no regional wall motion abnormalities. The left ventricular internal cavity size was normal in size. There is no left ventricular hypertrophy. Left ventricular diastolic parameters were normal. Normal left ventricular filling pressure.  Right Ventricle: The right ventricular size is normal. No increase in right ventricular wall thickness. Right ventricular systolic function is normal. There is normal pulmonary artery systolic pressure. The tricuspid regurgitant velocity is 2.39 m/s, and with an assumed right atrial pressure of 3 mmHg, the estimated right ventricular systolic pressure is 25.8 mmHg.  Left Atrium: Left atrial size was normal in size.  Right Atrium: Right atrial size was normal in size.  Pericardium: There is no evidence of pericardial effusion.  Mitral Valve: The mitral valve is normal in structure. No evidence of mitral valve regurgitation. No evidence of mitral valve stenosis.  Tricuspid Valve: The tricuspid valve is normal in structure. Tricuspid valve regurgitation is trivial. No evidence of tricuspid stenosis.  Aortic Valve: The aortic valve was not well visualized. Aortic valve regurgitation is not visualized. Mild aortic valve sclerosis is present, with no evidence of aortic valve stenosis.  Pulmonic Valve: The pulmonic valve was normal in structure. Pulmonic valve regurgitation is not visualized. No evidence of pulmonic stenosis.  Aorta: The aortic root is normal in size and structure.  Venous: The inferior vena cava is normal in size with greater than 50% respiratory variability, suggesting right atrial pressure of 3  mmHg.  IAS/Shunts: No atrial level shunt detected by color flow Doppler.   LEFT VENTRICLE PLAX 2D LVIDd:         4.20 cm  Diastology LVIDs:         2.80 cm  LV e' medial:    7.94 cm/s LV PW:         1.00 cm  LV E/e' medial:  11.5 LV IVS:        0.80 cm  LV e' lateral:   9.68 cm/s LVOT diam:     1.70 cm  LV E/e' lateral: 9.4 LV SV:         48 LV SV Index:   24 LVOT Area:     2.27 cm   RIGHT VENTRICLE RV Basal diam:  2.70 cm RV S prime:     10.10 cm/s TAPSE (M-mode): 2.2 cm RVSP:           25.8 mmHg  LEFT ATRIUM             Index       RIGHT ATRIUM           Index LA diam:        3.20 cm 1.61 cm/m  RA Pressure: 3.00 mmHg LA Vol (A2C):   33.1 ml 16.67 ml/m RA Area:     9.88 cm LA Vol (A4C):   33.9 ml 17.08 ml/m RA Volume:   18.10 ml  9.12 ml/m LA Biplane Vol: 34.4 ml 17.33 ml/m AORTIC VALVE LVOT Vmax:   105.00 cm/s LVOT Vmean:  69.200 cm/s LVOT VTI:    0.211 m  AORTA Ao Root diam: 2.90 cm Ao Asc diam:  3.00 cm  MITRAL VALVE                 TRICUSPID VALVE MV Area (PHT):               TR Peak grad:   22.8 mmHg MV Decel Time:               TR Vmax:  239.00 cm/s MR Peak grad:    100.4 mmHg  Estimated RAP:  3.00 mmHg MR Mean grad:    75.0 mmHg   RVSP:           25.8 mmHg MR Vmax:         501.00 cm/s MR Vmean:        423.0 cm/s  SHUNTS MR PISA:         1.37 cm    Systemic VTI:  0.21 m MR PISA Eff ROA: 8 mm       Systemic Diam: 1.70 cm MR PISA Radius:  0.47 cm MV E velocity: 91.20 cm/s MV A velocity: 106.00 cm/s MV E/A ratio:  0.86  Armanda Magic MD Electronically signed by Armanda Magic MD Signature Date/Time: 11/07/2020/2:21:03 PM    Final     CT SCANS  CT CARDIAC SCORING (SELF PAY ONLY) 11/07/2020  Addendum 11/07/2020  3:14 PM ADDENDUM REPORT: 11/07/2020 15:11  CLINICAL DATA:  Risk stratification: 67 Year-old African American Female  EXAM: Coronary Calcium Score  TECHNIQUE: The patient was scanned on a [employer] scanner.  Axial non-contrast 3 mm slices were carried out through the heart. The data set was analyzed on a dedicated work station and scored using the Agatson method.  FINDINGS: Non-cardiac: See separate report from [facility].  Ascending Aorta: Normal caliber.  Pericardium: Normal.  Coronary arteries: Normal origins.  Coronary Calcium Score:  Left main: 0  Left anterior descending artery: 0  Left circumflex artery: 0  Right coronary artery: 0  Total: 0  Percentile: 1st for age, sex, and race matched control.  IMPRESSION: 1. Coronary calcium score of 0. This was 1st percentile for age, gender, and race matched controls.  RECOMMENDATIONS:  Coronary artery calcium (CAC) score is a strong predictor of incident coronary heart disease (CHD) and provides predictive information beyond traditional risk factors. CAC scoring is reasonable to use in the decision to withhold, postpone, or initiate statin therapy in intermediate-risk or selected borderline-risk asymptomatic adults (age 54-75 years and LDL-C >=70 to <190 mg/dL) who do not have diabetes or established atherosclerotic cardiovascular disease (ASCVD).* In intermediate-risk (10-year ASCVD risk >=7.5% to <20%) adults or selected borderline-risk (10-year ASCVD risk >=5% to <7.5%) adults in whom a CAC score is measured for the purpose of making a treatment decision the following recommendations have been made:  If CAC = 0, it is reasonable to withhold statin therapy and reassess in 5 to 10 years, as long as higher risk conditions are absent (diabetes mellitus, family history of premature CHD in first degree relatives (males <55 years; females <65 years), cigarette smoking, LDL >=190 mg/dL or other independent risk factors).  If CAC is 1 to 99, it is reasonable to initiate statin therapy for patients >=29 years of age.  If CAC is >=100 or >=75th percentile, it is reasonable to initiate statin therapy at any  age.  Cardiology referral should be considered for patients with CAC scores =400 or >=75th percentile.  *2018 AHA/ACC/AACVPR/AAPA/ABC/ACPM/ADA/AGS/APhA/ASPC/NLA/PCNA Guideline on the Management of Blood Cholesterol: A Report of the American College of Cardiology/American Heart Association Task Force on Clinical Practice Guidelines. J Am Coll Cardiol. 2019;73(24):3168-3209.  Riley Lam, MD   Electronically Signed By: Riley Lam M.D. On: 11/07/2020 15:11  Narrative EXAM: OVER-READ INTERPRETATION  CT CHEST  The following report is an over-read performed by radiologist Dr. Trudie Reed of [facility] on 11/07/2020. This over-read does not include interpretation of cardiac or coronary anatomy or  pathology. The coronary calcium score/coronary CTA interpretation by the cardiologist is attached.  COMPARISON:  None.  FINDINGS: Within the visualized portions of the thorax there are no suspicious appearing pulmonary nodules or masses, there is no acute consolidative airspace disease, no pleural effusions, no pneumothorax and no lymphadenopathy. Visualized portions of the upper abdomen are unremarkable. There are no aggressive appearing lytic or blastic lesions noted in the visualized portions of the skeleton.  IMPRESSION: No significant incidental noncardiac findings are noted.  Electronically Signed: By: Trudie Reed M.D. On: 11/07/2020 14:03          Physical Exam:    VS:  BP 126/74   Pulse 88   Ht 5' 2.5" (1.588 m)   Wt 191 lb 3.2 oz (86.7 kg)   SpO2 97%   BMI 34.41 kg/m     Wt Readings from Last 3 Encounters:  10/02/22 191 lb 3.2 oz (86.7 kg)  09/25/22 188 lb (85.3 kg)  10/14/20 212 lb 12.8 oz (96.5 kg)    GEN: Obese. No acute distress HEENT: Normal NECK: No JVD. CARDIAC: No murmur. RRR or edema. VASCULAR:  Normal Pulses. No bruits. RESPIRATORY:  Clear to auscultation without rales, wheezing or rhonchi  ABDOMEN:  Soft, non-tender, non-distended MUSCULOSKELETAL: No deformity  SKIN: Warm and dry NEUROLOGIC:  Alert and oriented x 3 PSYCHIATRIC:  Normal affect   ASSESSMENT:    1. Abnormal electrocardiogram (ECG) (EKG)   2. Mixed hyperlipidemia   3. Class 1 obesity due to excess calories without serious comorbidity with body mass index (BMI) of 30.0 to 30.9 in adult   4. OSA (obstructive sleep apnea)     PLAN:    HLD with Zero Calcium score - with CKD and Obesity - discussed exercise, she will increase to 180 minutes a week - diet as below Will get HS-CRP, Lp(a) and fasting lipids  OSA - CPAP is recommended  Diet Prescription Type: Mediterranean Calorie goal: 1600 +/- activity Weight loss goal if applicable: 22 lbs; 1 lbs a week Limitations: Husband cookes Meal plan: Created one day meal plan and gave online resources for weekly planning; patient is amenable to diet created         Medication Adjustments/Labs and Tests Ordered: Current medicines are reviewed at length with the patient today.  Concerns regarding medicines are outlined above.  Orders Placed This Encounter  Procedures   Lipid panel   High sensitivity CRP   Lipoprotein A (LPA)   EKG 12-Lead    No orders of the defined types were placed in this encounter.    Patient Instructions  Medication Instructions:  Your physician recommends that you continue on your current medications as directed. Please refer to the Current Medication list given to you today.  *If you need a refill on your cardiac medications before your next appointment, please call your pharmacy*   Lab Work: FLP, CRP, Lpa  If you have labs (blood work) drawn today and your tests are completely normal, you will receive your results only by: MyChart Message (if you have MyChart) OR A paper copy in the mail If you have any lab test that is abnormal or we need to change your treatment, we will call you to review the  results.   Testing/Procedures: NONE   Follow-Up: At [facility], you and your health needs are our priority.  As part of our continuing mission to provide you with exceptional heart care, we have created designated Provider Care Teams.  These Care Teams  include your primary Cardiologist (physician) and Advanced Practice Providers (APPs -  Physician Assistants and Nurse Practitioners) who all work together to provide you with the care you need, when you need it.   Your next appointment:   1 year(s)  Provider:   Riley Lam, MD       Signed, Christell Constant, MD  10/02/2022 9:46 AM    [facility]

## 2022-10-02 ENCOUNTER — Encounter: Payer: Self-pay | Admitting: Internal Medicine

## 2022-10-02 ENCOUNTER — Ambulatory Visit: Payer: Medicare Other | Attending: Cardiology | Admitting: Internal Medicine

## 2022-10-02 VITALS — BP 126/74 | HR 88 | Ht 62.5 in | Wt 191.2 lb

## 2022-10-02 DIAGNOSIS — E6609 Other obesity due to excess calories: Secondary | ICD-10-CM | POA: Diagnosis present

## 2022-10-02 DIAGNOSIS — R9431 Abnormal electrocardiogram [ECG] [EKG]: Secondary | ICD-10-CM | POA: Diagnosis present

## 2022-10-02 DIAGNOSIS — Z6830 Body mass index (BMI) 30.0-30.9, adult: Secondary | ICD-10-CM | POA: Insufficient documentation

## 2022-10-02 DIAGNOSIS — G4733 Obstructive sleep apnea (adult) (pediatric): Secondary | ICD-10-CM | POA: Diagnosis present

## 2022-10-02 DIAGNOSIS — E782 Mixed hyperlipidemia: Secondary | ICD-10-CM | POA: Insufficient documentation

## 2022-10-02 NOTE — Patient Instructions (Signed)
Medication Instructions:  Your physician recommends that you continue on your current medications as directed. Please refer to the Current Medication list given to you today.  *If you need a refill on your cardiac medications before your next appointment, please call your pharmacy*   Lab Work: FLP, CRP, Lpa  If you have labs (blood work) drawn today and your tests are completely normal, you will receive your results only by: MyChart Message (if you have MyChart) OR A paper copy in the mail If you have any lab test that is abnormal or we need to change your treatment, we will call you to review the results.   Testing/Procedures: NONE   Follow-Up: At [facility], you and your health needs are our priority.  As part of our continuing mission to provide you with exceptional heart care, we have created designated Provider Care Teams.  These Care Teams include your primary Cardiologist (physician) and Advanced Practice Providers (APPs -  Physician Assistants and Nurse Practitioners) who all work together to provide you with the care you need, when you need it.   Your next appointment:   1 year(s)  Provider:   Riley Lam, MD

## 2022-10-03 LAB — LIPID PANEL
Chol/HDL Ratio: 3 ratio (ref 0.0–4.4)
Cholesterol, Total: 161 mg/dL (ref 100–199)
HDL: 54 mg/dL (ref >39)
LDL Chol Calc (NIH): 84 mg/dL (ref 0–99)
Triglycerides: 134 mg/dL (ref 0–149)
VLDL Cholesterol Cal: 23 mg/dL (ref 5–40)

## 2022-10-03 LAB — HIGH SENSITIVITY CRP: CRP, High Sensitivity: 4.48 mg/L — ABNORMAL HIGH (ref 0.00–3.00)

## 2022-10-03 LAB — LIPOPROTEIN A (LPA): Lipoprotein (a): 318.6 nmol/L — ABNORMAL HIGH (ref <75.0)

## 2022-10-04 ENCOUNTER — Telehealth: Payer: Self-pay | Admitting: Internal Medicine

## 2022-10-04 DIAGNOSIS — E782 Mixed hyperlipidemia: Secondary | ICD-10-CM

## 2022-10-04 MED ORDER — ATORVASTATIN CALCIUM 80 MG PO TABS: 80.0000 mg | ORAL_TABLET | Freq: Every day | ORAL | 3 refills | Status: DC

## 2022-10-04 NOTE — Telephone Encounter (Signed)
-----   Message from Christell Constant sent at 10/03/2022 11:50 AM EDT ----- Results: Elevated hs CRP and LP(A) Plan: Increase to atorvastatin 80 mg, monitor for myalgias, repeat lipids and hs-CRP in three months may add medication to target inflammation.  Christell Constant, MD

## 2022-10-04 NOTE — Telephone Encounter (Signed)
Pt calling requesting a refill on atorvastatin 80 mg. Pt stated that this medication was increased. This was not done. Please address

## 2022-10-04 NOTE — Telephone Encounter (Signed)
*  STAT* If patient is at the pharmacy, call can be transferred to refill team.   1. Which medications need to be refilled? (please list name of each medication and dose if known) atorvastatin (LIPITOR) 80 MG tablet    2. Would you like to learn more about the convenience, safety, & potential cost savings by using the [facility]?   3. Are you open to using the [facility] (Type [facility]. ).   4. Which pharmacy/location (including street and city if local pharmacy) is medication to be sent to? [id number] - [place],  - [address]    5. Do they need a 30 day or 90 day supply? 30 day  Increase in medication

## 2022-10-04 NOTE — Telephone Encounter (Signed)
The patient has been notified of the result and verbalized understanding.  All questions (if any) were answered. Macie Burows, RN 10/04/2022 9:33 AM   Pt will come in for f/u labs on 12/31/22.

## 2022-12-31 ENCOUNTER — Ambulatory Visit: Payer: Medicare Other | Attending: Internal Medicine

## 2022-12-31 DIAGNOSIS — E782 Mixed hyperlipidemia: Secondary | ICD-10-CM

## 2022-12-31 LAB — LIPID PANEL
Chol/HDL Ratio: 3.1 ratio (ref 0.0–4.4)
Cholesterol, Total: 150 mg/dL (ref 100–199)
HDL: 49 mg/dL (ref >39)
LDL Chol Calc (NIH): 86 mg/dL (ref 0–99)
Triglycerides: 78 mg/dL (ref 0–149)
VLDL Cholesterol Cal: 15 mg/dL (ref 5–40)

## 2022-12-31 LAB — ALT: ALT: 27 [IU]/L (ref 0–32)

## 2023-01-02 ENCOUNTER — Telehealth: Payer: Self-pay | Admitting: Internal Medicine

## 2023-01-02 ENCOUNTER — Other Ambulatory Visit: Payer: Self-pay | Admitting: Internal Medicine

## 2023-01-02 DIAGNOSIS — E782 Mixed hyperlipidemia: Secondary | ICD-10-CM

## 2023-01-02 DIAGNOSIS — Z1231 Encounter for screening mammogram for malignant neoplasm of breast: Secondary | ICD-10-CM

## 2023-01-02 MED ORDER — EZETIMIBE 10 MG PO TABS: 10.0000 mg | ORAL_TABLET | Freq: Every day | ORAL | 3 refills | Status: DC

## 2023-01-02 MED ORDER — ATORVASTATIN CALCIUM 80 MG PO TABS: 80.0000 mg | ORAL_TABLET | Freq: Every day | ORAL | 3 refills | Status: DC

## 2023-01-02 NOTE — Telephone Encounter (Signed)
The patient has been notified of the result and verbalized understanding.  All questions (if any) were answered. Arvid Right Kyal Arts, RN 01/02/2023 2:36 PM   Pt reports was taking atorvastatin 40 mg for about a week and a half.  Pharmacy dispensed 40 mg instead of 80. Resent script for atorvastatin 80 mg to pt requested pharmacy.  Orders placed and released for draw.  Pt advised to have FLP, ALT drawn in late Feb.  Pt expresses understanding.

## 2023-01-02 NOTE — Telephone Encounter (Signed)
Pt returning nurses call regarding lab results. Please advise 

## 2023-01-23 ENCOUNTER — Encounter: Payer: Self-pay | Admitting: Endocrinology

## 2023-01-23 ENCOUNTER — Ambulatory Visit (INDEPENDENT_AMBULATORY_CARE_PROVIDER_SITE_OTHER): Payer: Medicare Other | Admitting: Endocrinology

## 2023-01-23 VITALS — BP 152/70 | HR 78 | Resp 20 | Ht 62.5 in | Wt 185.2 lb

## 2023-01-23 DIAGNOSIS — E042 Nontoxic multinodular goiter: Secondary | ICD-10-CM | POA: Diagnosis not present

## 2023-01-23 DIAGNOSIS — E041 Nontoxic single thyroid nodule: Secondary | ICD-10-CM | POA: Diagnosis not present

## 2023-01-23 NOTE — Progress Notes (Signed)
Outpatient Endocrinology Note [place] Sadiya Durand, MD  01/23/23  Patient's Name: [name]    DOB: 1955-07-24    MRN: [id number]  REASON OF VISIT: New consult for thyroid nodules   PCP: Charlette Caffey, MD  HISTORY OF PRESENT ILLNESS:   [name] is a 67 y.o. old female with past medical history as listed below is presented for new consult for thyroid nodules.  Pertinent Thyroid History:  Patient noticed left thyroid enlargement in March 2024 and had ultrasound thyroid in May 18, 2022 at [facility] showed left complex thyroid nodule measuring 4.2 cm.  Patient is presented today for the evaluation and management of thyroid nodules.  Ultrasound thyroid is scanned into media report reviewed and noted as follows.  No images available to review.  Right lobe measured 4.3 x 2.2 x 2.6 cm, left lobe measures 6.0 x 3.7 x 3.9 cm.  Isthmus measures 0.37 cm.  0.8 cm hypoechoic right upper pole nodule.  5 mm right lower pole colloid cyst.  4.2 cm complex left midpole nodule.  Patient is known to have thyroid nodule, history of ultrasound thyroid monitoring from 2008-2012.  Ultrasound in March 2012 showed left complex nodule measuring 1.4 x 1.0 x 1.3 cm with cystic degeneration.  Left hypoechoic nodule measuring 0.6 cm.  She had ultrasound thyroid in 2018, 2009, 2017 and 2012 showing more than 1 cm left thyroid nodule and other smaller bilateral thyroid nodules, images reviewed.  Family history of thyroid disorder.  Mother had  thyroidectomy requiring levothyroxine.  She has daughter with enlarged thyroid and weight loss, ?  hypothyroidism, on thyroid medication, does not know the details.  No family history of thyroid cancer.  Patient has noticed left thyroid nodule in March 2024 and it has been noticeable probably slightly bigger as her friends are also noticing since then.  She denies dysphagia, neck discomfort, choking or change in voice.  She has difficulty losing weight.  No  change in bowel habit.  Overall feels normal energy.  She has history of iron deficiency and her energy level has improved after being on iron supplement.  Denies cold or heat intolerance.  No palpitation.  She has never been on thyroid medication.  She has prediabetes.  Patient thinks he has type 2 diabetes mellitus, used to be on Ozempic and was later stopped, she was told that was stopped due to her A1c being in the 6% range.  She is currently not on antidiabetic medication.  Managed by primary care provider.  Interval history  Patient presented for evaluation and management of thyroid nodules.  REVIEW OF SYSTEMS:  As per history of present illness.   PAST MEDICAL HISTORY: Past Medical History:  Diagnosis Date   Anemia    Hyperlipidemia    Pre-diabetes    Seasonal allergies    Sleep apnea    Does not use CPAP every night   SVD (spontaneous vaginal delivery)    x 2    PAST SURGICAL HISTORY: Past Surgical History:  Procedure Laterality Date   COLONOSCOPY  2008   DILATION AND CURETTAGE OF UTERUS  04/2011   POLYPECTOMY  05/28/2011   Procedure: POLYPECTOMY;  Surgeon: Fortino Sic, MD;  Location: [facility];  Service: Gynecology;  Laterality: N/A;   ROBOTIC ASSISTED TOTAL HYSTERECTOMY Bilateral 03/11/2013   Procedure: ROBOTIC ASSISTED TOTAL HYSTERECTOMY WITH BILATERAL SALPINGO-OOPHORECTOMY;  Surgeon: Serita Kyle, MD;  Location: [facility];  Service: Gynecology;  Laterality: Bilateral;   SHOULDER ARTHROSCOPY  Right 2015   svd     x 2   TONSILLECTOMY     TOTAL KNEE REVISION Right 06/11/2019   Procedure: TOTAL KNEE REVISION;  Surgeon: Durene Romans, MD;  Location: [facility];  Service: Orthopedics;  Laterality: Right;  120 mins   TUBAL LIGATION      ALLERGIES: Allergies  Allergen Reactions   Contrast Media [Iodinated Contrast Media] Anaphylaxis    Throat swelling, required epinephrine and benadryl a long with monitoring in the ED.   Ibuprofen Rash   Naproxen Sodium Rash     FAMILY HISTORY:  Family History  Problem Relation Age of Onset   Diabetes Mother    Hyperlipidemia Mother    Hypertension Father    Heart disease Father     SOCIAL HISTORY: Social History   Socioeconomic History   Marital status: Married    Spouse name: Not on file   Number of children: Not on file   Years of education: Not on file   Highest education level: Not on file  Occupational History   Not on file  Tobacco Use   Smoking status: Never   Smokeless tobacco: Never  Vaping Use   Vaping status: Never Used  Substance and Sexual Activity   Alcohol use: Yes    Comment: wine - weekends   Drug use: No   Sexual activity: Yes    Birth control/protection: Surgical  Other Topics Concern   Not on file  Social History Narrative   Not on file   Social Determinants of Health   Financial Resource Strain: Not on file  Food Insecurity: Low Risk  (01/08/2023)   Received from [facility]   Hunger Vital Sign    Worried About Running Out of Food in the Last Year: Never true    Ran Out of Food in the Last Year: Never true  Transportation Needs: No Transportation Needs (01/08/2023)   Received from [facility]    In the past 12 months, has lack of reliable transportation kept you from medical appointments, meetings, work or from getting things needed for daily living? : No  Physical Activity: Not on file  Stress: Not on file  Social Connections: Not on file    MEDICATIONS:  Current Outpatient Medications  Medication Sig Dispense Refill   acetaminophen (TYLENOL) 500 MG tablet Take 2 tablets (1,000 mg total) by mouth every 8 (eight) hours. (Patient taking differently: Take 1,000 mg by mouth daily as needed for moderate pain (pain score 4-6).) 30 tablet 0   Ascorbic Acid (VITAMIN C) 1000 MG tablet Take 1,000 mg by mouth daily.     atorvastatin (LIPITOR) 80 MG tablet Take 1 tablet (80 mg total) by mouth daily. 90 tablet 3   celecoxib (CELEBREX) 100 MG  capsule Take 100 mg by mouth daily. Can take 2 tablets if needed     Cholecalciferol (VITAMIN D3) 25 MCG (1000 UT) CAPS Take by mouth. 1 tab daily     diphenhydrAMINE (BENADRYL) 25 MG tablet Take 1 tablet (25 mg total) by mouth every 6 (six) hours as needed. 30 tablet 0   EPINEPHrine 0.3 mg/0.3 mL IJ SOAJ injection Inject 0.3 mg into the muscle as needed for anaphylaxis. 1 each 0   ezetimibe (ZETIA) 10 MG tablet Take 1 tablet (10 mg total) by mouth daily. 90 tablet 3   fluticasone (FLONASE) 50 MCG/ACT nasal spray Place 1 spray into both nostrils daily as needed for allergies (allergies.).     Loratadine (  CLARITIN PO) Take by mouth. daily     OZEMPIC, 1 MG/DOSE, 4 MG/3ML SOPN Inject 1 mg into the skin once a week.     No current facility-administered medications for this visit.    PHYSICAL EXAM: Vitals:   01/23/23 1311 01/23/23 1333  BP: (!) 150/70 (!) 152/70  Pulse: 78   Resp: 20   SpO2: 99%   Weight: 185 lb 3.2 oz (84 kg)   Height: 5' 2.5" (1.588 m)    Body mass index is 33.33 kg/m.  Wt Readings from Last 3 Encounters:  01/23/23 185 lb 3.2 oz (84 kg)  10/02/22 191 lb 3.2 oz (86.7 kg)  09/25/22 188 lb (85.3 kg)     General: Well developed, well nourished female in no apparent distress.  HEENT: AT/[place], no external lesions. Hearing intact to the spoken word Eyes: EOMI. No stare, proptosis. Conjunctiva clear and no icterus. Neck: Trachea midline, neck supple with appreciable thyromegaly L > R or no lymphadenopathy and palpable left thyroid nodule, mobile, non tender ~ 4 cm.  Lungs: Clear to auscultation, no wheeze. Respirations not labored Heart: S1S2, Regular in rate and rhythm.  Abdomen: Soft, non tender, non distended Neurologic: Alert, oriented, normal speech, deep tendon biceps reflexes normal. Extremities: No pedal pitting edema, no tremors of outstretched hands Skin: Warm, color good.  Psychiatric: Does not appear depressed or anxious  PERTINENT HISTORIC LABORATORY AND  IMAGING STUDIES:  All pertinent laboratory results were reviewed. Please see HPI also for further details.   ASSESSMENT / PLAN  1. Multiple thyroid nodules   2. Left thyroid nodule    -Patient is known to have thyroid nodules, mostly smaller and left thyroid nodule around 1 cm since 2008 -2012 timeframe.  In March 2024 she noticed larger left thyroid nodule and had ultrasound thyroid in April 2024 showed left thyroid nodule complex measuring 4.2 cm.  There are other small bilateral thyroid nodules. -Patient denies neck compressive symptoms.  She is euthyroid.  Not on thyroid medication.  Family history of thyroid disorder but no family Struve thyroid cancer.  No radiation exposure.  -Most recent ultrasound in April 2022 was done in [facility], no images available to review, report is scanned into media.  Plan: -I would like to check thyroid function test today. -Left thyroid nodule is complex 4.2 cm has increased in size compared to ultrasound in 2012.  Based on size this nodule need fine-needle aspiration/needle biopsy.  Will refer to interventional radiology. -Provided cytology is benign we will repeat ultrasound thyroid in about 6 months to monitor. -Patient is asked to request ultrasound images from where she had done ultrasound in April and mail to [place].   Thyroid nodule / FNA talk: -Approximately 5% of individuals have palpable thyroid nodules and 30% or more of adults have non-palpable nodules. The prevalence of nodules increases with age, so that perhaps 50% of individuals older than 67 years of age have nodules. Many of these nodules will be well under 1cm in size.  -The incidence of thyroid cancer is low, but rising. The prevalence of thyroid cancer is low compared with the prevalence of thyroid nodule.  -There have been a number of studies that have estimated risk of malignancy in thyroid nodule. The estimated risk varies with size and other characteristics of nodules  selected for biopsy, the technique used for the biopsy, the institution and its referral patterns, and the characteristics of the local population. Most studies have estimated malignancy risk in nodules that  are palpable or > 1cm on U/S to be in the range of 3-15%.    - In general, options for further evaluation include: - Follow with serial U/S - Fine needle aspiration biopsy - Thyroidectomy - In general the criteria for FNA biopsy includes:  - All palpable nodules - Generally nodules > 1 and for some nodules up to > 1 to 2 cm, based on other criteria.  - Rarely Nodules > 8-35mm in two or more dimensions with high risk sonographic features, or occuring in patients with high risk historical features. Nodules not meeting the above criteria can generally be followed with serial ultrasounds.  -Thyroid FNA is required for further evaluation of nodule to see if suspicious for cancer which may require further surgical treatment. -There are false positive and false negative results with FNA and need for repeat FNA or surgical resection in some cases and also possibility of missing a diagnosis of Cancer in 5-15% of cases. -discussed the risk and benefits of procedure and potential complications such as bleeding, pain in neck, swelling in neck in rare cases due to hematoma formation and causing respiratory compromise, possibility of infection etc. -discussed the need for continued follow up even if the nodule were found to be benign on FNA.  -discussed that the only near 100% method of ruling out thyroid cancer is by surgical resection. -patient agreeable for Thyroid FNA.   You may read about thyroid nodule from this link of American Thyroid Association.  [email or web address].com.br  We have reviewed our plan outlined above with the patient verbalizes understanding.    All questions were answered.  I provided ATA hands out about thyroid nodules and FNA procedure.   Diagnoses and all  orders for this visit:  Multiple thyroid nodules -     T4, free -     TSH -     [place] FNA BX THYROID 1ST LESION AFIRMA; Future  Left thyroid nodule    DISPOSITION Follow up in clinic in 6 months suggested.  All questions answered and patient verbalized understanding of the plan.  [place] Acelyn Basham, MD [facility] Endocrinology [facility] [address] [place], [address] [place], [place] [address] Phone # [phone]  At least part of this note was generated using voice recognition software. Inadvertent word errors may have occurred, which were not recognized during the proofreading process.

## 2023-01-23 NOTE — Patient Instructions (Signed)
Will refer to needle biopsy of left thyroid nodule.  Please get images of US done in 05/2022 and provide to [place].

## 2023-01-24 ENCOUNTER — Telehealth: Payer: Self-pay

## 2023-01-24 LAB — T4, FREE: Free T4: 1 ng/dL (ref 0.8–1.8)

## 2023-01-24 LAB — TSH: TSH: 0.62 m[IU]/L (ref 0.40–4.50)

## 2023-01-24 NOTE — Telephone Encounter (Signed)
Patient called after receiving call from [facility] form [facility] requesting thyroid U/S on a disc, spoke with patient, patient to pick up copy of disc from radiologist office where U/S was performed.

## 2023-01-31 ENCOUNTER — Telehealth: Payer: Self-pay | Admitting: Internal Medicine

## 2023-01-31 ENCOUNTER — Ambulatory Visit
Admission: RE | Admit: 2023-01-31 | Discharge: 2023-01-31 | Disposition: A | Discharge status: Home / Self Care | Payer: Medicare Other | Source: Ambulatory Visit | Attending: Internal Medicine | Admitting: Internal Medicine

## 2023-01-31 DIAGNOSIS — E782 Mixed hyperlipidemia: Secondary | ICD-10-CM

## 2023-01-31 DIAGNOSIS — Z1231 Encounter for screening mammogram for malignant neoplasm of breast: Secondary | ICD-10-CM

## 2023-01-31 NOTE — Telephone Encounter (Signed)
Pt returning call to nurse

## 2023-01-31 NOTE — Telephone Encounter (Signed)
Spoke with patient and she states since been taking Zetia her joints and muscles has been hurting. She states she would like to know if you have another option for her.

## 2023-01-31 NOTE — Telephone Encounter (Signed)
Pt c/o medication issue:  1. Name of Medication:   ezetimibe (ZETIA) 10 MG tablet   2. How are you currently taking this medication (dosage and times per day)?   As prescribed  3. Are you having a reaction (difficulty breathing--STAT)?   Aching joints and muscle pain  4. What is your medication issue?   Patient stated she has been having aching joints and muscle pain when taking this medication and wants to know next steps.

## 2023-01-31 NOTE — Telephone Encounter (Signed)
Left voicemail to return call to office.

## 2023-02-01 ENCOUNTER — Ambulatory Visit: Payer: Medicare Other

## 2023-02-01 NOTE — Telephone Encounter (Signed)
Called pt advised of MD response: Chart reviewed:  - Patient has elevated Lpa and HsCRP, with HLD  - at current therapy, unable to tolerate due to myalgias  - Schedule a lipid clinic eval; her insurance may cover novel agents that have have less side effects.    Advised pt to stop Zetia; scheduler to contact pt to set up Lipid clinic OV.  Pt had no further questions or concerns.

## 2023-02-18 ENCOUNTER — Ambulatory Visit
Admission: RE | Admit: 2023-02-18 | Discharge: 2023-02-18 | Disposition: A | Discharge status: Home / Self Care | Payer: No Typology Code available for payment source | Source: Ambulatory Visit | Attending: Endocrinology | Admitting: Endocrinology

## 2023-02-18 ENCOUNTER — Other Ambulatory Visit (HOSPITAL_COMMUNITY)
Admission: RE | Admit: 2023-02-18 | Discharge: 2023-02-18 | Disposition: A | Discharge status: Home / Self Care | Payer: Medicare PPO | Source: Ambulatory Visit | Attending: Endocrinology | Admitting: Endocrinology

## 2023-02-18 DIAGNOSIS — E042 Nontoxic multinodular goiter: Secondary | ICD-10-CM | POA: Diagnosis present

## 2023-02-20 ENCOUNTER — Other Ambulatory Visit: Payer: Self-pay | Admitting: Endocrinology

## 2023-02-20 DIAGNOSIS — E041 Nontoxic single thyroid nodule: Secondary | ICD-10-CM

## 2023-02-20 DIAGNOSIS — E042 Nontoxic multinodular goiter: Secondary | ICD-10-CM

## 2023-02-20 LAB — CYTOLOGY - NON PAP

## 2023-02-21 ENCOUNTER — Telehealth: Payer: Self-pay

## 2023-02-21 NOTE — Telephone Encounter (Signed)
 Patient given results  as directed by MD. No further questions at this time.

## 2023-02-21 NOTE — Telephone Encounter (Signed)
-----   Message from [place] Thapa sent at 02/20/2023  3:39 PM EST ----- Cytology reviewed is negative for thyroid  cancer, scanty follicular epithelium and satisfactory for evaluation.  I would like to repeat ultrasound thyroid  1 week prior to follow-up visit with me in June.  I have placed order, please inform the patient.

## 2023-03-19 ENCOUNTER — Telehealth: Payer: Self-pay | Admitting: Pharmacy Technician

## 2023-03-19 ENCOUNTER — Other Ambulatory Visit (HOSPITAL_COMMUNITY): Payer: Self-pay

## 2023-03-19 ENCOUNTER — Ambulatory Visit: Payer: Medicare Other | Attending: Internal Medicine | Admitting: Pharmacist

## 2023-03-19 DIAGNOSIS — Z6830 Body mass index (BMI) 30.0-30.9, adult: Secondary | ICD-10-CM | POA: Insufficient documentation

## 2023-03-19 DIAGNOSIS — E66811 Obesity, class 1: Secondary | ICD-10-CM

## 2023-03-19 DIAGNOSIS — E6609 Other obesity due to excess calories: Secondary | ICD-10-CM | POA: Insufficient documentation

## 2023-03-19 DIAGNOSIS — E782 Mixed hyperlipidemia: Secondary | ICD-10-CM | POA: Diagnosis present

## 2023-03-19 DIAGNOSIS — E119 Type 2 diabetes mellitus without complications: Secondary | ICD-10-CM

## 2023-03-19 NOTE — Telephone Encounter (Signed)
-----   Message from Olene Floss sent at 03/19/2023  2:23 PM EST ----- Please do prior authorization for Repatha.  Please also do PA for Ozempic patient with A1c 6.5 on 04/20/2020.  You can find these labs scanned in 07/21/2020

## 2023-03-19 NOTE — Assessment & Plan Note (Signed)
 Assessment: LDL-C is above goal less than 55 due to elevated LP(a) and diabetes Tolerating atorvastatin  80 mg daily Did not tolerate ezetimibe  Concerned about her insurance not covering Ozempic  anymore.  However she did have an A1c of 6.5 in March 2022 She works out with a psychologist, educational doing weight lifting and cardio Discussed PCSK9  Plan: Patient willing to try Repatha  We will submit prior authorization Continue atorvastatin  80 mg daily Patient is interested in LP(a) trial when 1 is available.  I have added her to the list for when they start enrolling at [facility]

## 2023-03-19 NOTE — Patient Instructions (Signed)

## 2023-03-19 NOTE — Telephone Encounter (Signed)
 Pharmacy Patient Advocate Encounter  Received notification from [facility] that Prior Authorization for repatha  has been APPROVED from 03/19/23 to 03/17/24. Ran test claim, Copay is $47.00 one month. This test claim was processed through [facility]- copay amounts may vary at other pharmacies due to pharmacy/plan contracts, or as the patient moves through the different stages of their insurance plan.   PA #/Case ID/Reference #: [id number]

## 2023-03-19 NOTE — Telephone Encounter (Signed)
Pharmacy Patient Advocate Encounter   Received notification from Physician's Office that prior authorization for repatha is required/requested.   Insurance verification completed.   The patient is insured through [facility] .   Per test claim: PA required; PA submitted to above mentioned insurance via CoverMyMeds Key/confirmation #/EOC [id number] Status is pending

## 2023-03-19 NOTE — Progress Notes (Signed)
 Patient ID: [name]                 DOB: 1955-10-08                    MRN: [id number]      HPI: [name] is a 68 y.o. female patient referred to lipid clinic by Dr. Santo. PMH is significant for diabetes mellitus type 2, hyperlipidemia, OSA, CKD 3.  Coronary calcium  score of 0 in 2022.  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  LDL-C 86 on atorvastatin  80 mg daily.  Patient did not tolerate ezetimibe .  Patient presents today to the lipid clinic.  Reviewed with patient her coronary calcium  score, LP(a) and CRP.  Reviewed how these are risk factors.  Discussed how we do not have any FDA approved drugs for LP(a) but that PCSK9 can lower about 20 to 25%.  Discussed the importance of lowering LDL-C.  No issues with atorvastatin  80.  Reviewed PCSK9 efficacy, side effects, injection technique and cost.  Current Medications: atorvastatin  80 mg daily Intolerances: zetia  (joint/muscle aches) Risk Factors: Diabetes, CKD, elevated LP(a) LDL-C goal: <55 ApoB goal: <60  Exercise: works out with trainer, weight lifting and cardio. Going to start walking  Family History:  Family History  Problem Relation Age of Onset   Diabetes Mother    Hyperlipidemia Mother    Hypertension Father    Heart disease Father    BRCA 1/2 Neg Hx    Breast cancer Neg Hx     Social History: wine occasionally, no tobacco  Labs: Lipid Panel     Component Value Date/Time   CHOL 150 12/31/2022 1042   TRIG 78 12/31/2022 1042   HDL 49 12/31/2022 1042   CHOLHDL 3.1 12/31/2022 1042   CHOLHDL 4.4 04/02/2008 0435   VLDL 19 04/02/2008 0435   LDLCALC 86 12/31/2022 1042   LABVLDL 15 12/31/2022 1042    Past Medical History:  Diagnosis Date   Anemia    Hyperlipidemia    Pre-diabetes    Seasonal allergies    Sleep apnea    Does not use CPAP every night   SVD (spontaneous vaginal delivery)    x 2    Current Outpatient Medications on File Prior to Visit  Medication Sig Dispense Refill   Ascorbic Acid (VITAMIN C)  1000 MG tablet Take 1,000 mg by mouth daily.     atorvastatin  (LIPITOR) 80 MG tablet Take 1 tablet (80 mg total) by mouth daily. 90 tablet 3   celecoxib  (CELEBREX ) 100 MG capsule Take 100 mg by mouth daily. Can take 2 tablets if needed     Cholecalciferol (VITAMIN D3) 25 MCG (1000 UT) CAPS Take by mouth. 1 tab daily     diphenhydrAMINE  (BENADRYL ) 25 MG tablet Take 1 tablet (25 mg total) by mouth every 6 (six) hours as needed. 30 tablet 0   fluticasone  (FLONASE ) 50 MCG/ACT nasal spray Place 1 spray into both nostrils daily as needed for allergies (allergies.).     Loratadine (CLARITIN PO) Take by mouth. daily     Semaglutide , 2 MG/DOSE, (OZEMPIC , 2 MG/DOSE,) 8 MG/3ML SOPN Inject 2 mg into the skin once a week.     acetaminophen  (TYLENOL ) 500 MG tablet Take 2 tablets (1,000 mg total) by mouth every 8 (eight) hours. (Patient not taking: Reported on 03/19/2023) 30 tablet 0   EPINEPHrine  0.3 mg/0.3 mL IJ SOAJ injection Inject 0.3 mg into the muscle as needed for anaphylaxis. 1 each 0  No current facility-administered medications on file prior to visit.    Allergies  Allergen Reactions   Contrast Media [Iodinated Contrast Media] Anaphylaxis    Throat swelling, required epinephrine  and benadryl  a long with monitoring in the ED.   Ibuprofen  Rash   Naproxen Sodium Rash    Assessment/Plan:  1. Hyperlipidemia -  Mixed hyperlipidemia Assessment: LDL-C is above goal less than 55 due to elevated LP(a) and diabetes Tolerating atorvastatin  80 mg daily Did not tolerate ezetimibe  Concerned about her insurance not covering Ozempic  anymore.  However she did have an A1c of 6.5 in March 2022 She works out with a psychologist, educational doing weight lifting and cardio Discussed PCSK9  Plan: Patient willing to try Repatha  We will submit prior authorization Continue atorvastatin  80 mg daily Patient is interested in LP(a) trial when 1 is available.  I have added her to the list for when they start enrolling at  [facility]  Class 1 obesity due to excess calories without serious comorbidity with body mass index (BMI) of 30.0 to 30.9 in adult Assessment: Patient currently paying cash for Ozempic  as per her report her insurance stopped covering it However patient has diagnosis of diabetes A1c 6.5 on 04/20/2020 She states that she lost about 26 pounds She is concerned about weight gain if she stops Her sciatica pain is gone, concerned it will come back  Plan: Will submit prior authorization and see if we can get Ozempic  approved    Thank you,  Shaydon Lease D Jeevan Kalla, Pharm.D, BCACP, CPP [facility] of  [facility] [address] [place], [place] [address]  Phone: [phone]; Fax: [phone]

## 2023-03-19 NOTE — Assessment & Plan Note (Signed)
 Assessment: Patient currently paying cash for Ozempic  as per her report her insurance stopped covering it However patient has diagnosis of diabetes A1c 6.5 on 04/20/2020 She states that she lost about 26 pounds She is concerned about weight gain if she stops Her sciatica pain is gone, concerned it will come back  Plan: Will submit prior authorization and see if we can get Ozempic  approved

## 2023-03-19 NOTE — Telephone Encounter (Signed)
 Pharmacy Patient Advocate Encounter   Received notification from Physician's Office that prior authorization for ozempic  is required/requested.   Insurance verification completed.   The patient is insured through [facility] .   Per test claim: PA required; PA submitted to above mentioned insurance via CoverMyMeds Key/confirmation #/EOC [id number] Status is pending

## 2023-03-20 ENCOUNTER — Encounter: Payer: Self-pay | Admitting: Pharmacist

## 2023-03-20 MED ORDER — REPATHA SURECLICK 140 MG/ML ~~LOC~~ SOAJ: 1.0000 mL | SUBCUTANEOUS | 11 refills | Status: DC

## 2023-03-20 MED ORDER — OZEMPIC (2 MG/DOSE) 8 MG/3ML ~~LOC~~ SOPN: 2.0000 mg | PEN_INJECTOR | SUBCUTANEOUS | 11 refills | Status: DC

## 2023-03-20 NOTE — Addendum Note (Signed)
 Addended by: Brody Bonneau D on: 03/20/2023 11:29 AM   Modules accepted: Orders

## 2023-03-20 NOTE — Telephone Encounter (Signed)
 Pharmacy Patient Advocate Encounter  Received notification from [facility] that Prior Authorization for ozempic  has been APPROVED from 03/19/23 to 03/18/26   PA #/Case ID/Reference #: [id number]

## 2023-03-20 NOTE — Telephone Encounter (Signed)
 Attempted to call pt to let her know of both repatha  and ozempic  approal. Home phone rang and rang. Cell phone went right to VM but VM not set up.

## 2023-03-21 NOTE — Telephone Encounter (Signed)
 Spoke with patient. She is aware of approval. Will go for labs in 3 months.

## 2023-03-21 NOTE — Addendum Note (Signed)
 Addended by: Tayonna Bacha D on: 03/21/2023 10:29 AM   Modules accepted: Orders

## 2023-04-19 MED ORDER — SEMAGLUTIDE (1 MG/DOSE) 4 MG/3ML ~~LOC~~ SOPN: 1.0000 mg | PEN_INJECTOR | SUBCUTANEOUS | 11 refills | Status: DC

## 2023-04-19 NOTE — Telephone Encounter (Signed)
 Patient called stating that she did a month of the Ozempic 2 mg but feels like it is too strong and would like to go back down to 1 mg.  [facility] Ozempic [facility]

## 2023-05-23 ENCOUNTER — Ambulatory Visit: Payer: Medicare Other | Admitting: Internal Medicine

## 2023-07-10 ENCOUNTER — Encounter: Payer: Self-pay | Admitting: Pharmacist

## 2023-07-12 LAB — LIPID PANEL
Chol/HDL Ratio: 1.9 ratio (ref 0.0–4.4)
Cholesterol, Total: 104 mg/dL (ref 100–199)
HDL: 54 mg/dL (ref >39)
LDL Chol Calc (NIH): 36 mg/dL (ref 0–99)
Triglycerides: 66 mg/dL (ref 0–149)
VLDL Cholesterol Cal: 14 mg/dL (ref 5–40)

## 2023-07-12 LAB — ALT: ALT: 31 IU/L (ref 0–32)

## 2023-07-15 ENCOUNTER — Ambulatory Visit
Admission: RE | Admit: 2023-07-15 | Discharge: 2023-07-15 | Disposition: A | Discharge status: Home / Self Care | Source: Ambulatory Visit | Attending: Endocrinology | Admitting: Endocrinology

## 2023-07-15 DIAGNOSIS — E041 Nontoxic single thyroid nodule: Secondary | ICD-10-CM

## 2023-07-15 DIAGNOSIS — E042 Nontoxic multinodular goiter: Secondary | ICD-10-CM

## 2023-07-18 ENCOUNTER — Ambulatory Visit: Attending: Cardiology | Admitting: Pharmacist

## 2023-07-18 VITALS — BP 150/84 | HR 72

## 2023-07-18 DIAGNOSIS — Z6830 Body mass index (BMI) 30.0-30.9, adult: Secondary | ICD-10-CM | POA: Insufficient documentation

## 2023-07-18 DIAGNOSIS — E6609 Other obesity due to excess calories: Secondary | ICD-10-CM | POA: Diagnosis present

## 2023-07-18 DIAGNOSIS — E782 Mixed hyperlipidemia: Secondary | ICD-10-CM | POA: Diagnosis present

## 2023-07-18 DIAGNOSIS — R030 Elevated blood-pressure reading, without diagnosis of hypertension: Secondary | ICD-10-CM | POA: Insufficient documentation

## 2023-07-18 DIAGNOSIS — E66811 Obesity, class 1: Secondary | ICD-10-CM | POA: Diagnosis present

## 2023-07-18 MED ORDER — OZEMPIC (2 MG/DOSE) 8 MG/3ML ~~LOC~~ SOPN: 2.0000 mg | PEN_INJECTOR | SUBCUTANEOUS | 11 refills | Status: DC

## 2023-07-18 NOTE — Assessment & Plan Note (Signed)
 Assessment: LDL-C is at goal Will keep her on list for potential LP(a) trial   Plan: Continue atorvastatin  80mg  daily and Repatha  140mg  q 14 days

## 2023-07-18 NOTE — Assessment & Plan Note (Signed)
 Plan: Increase ozempic  to 2mg  per patient request

## 2023-07-18 NOTE — Progress Notes (Signed)
 Patient ID: [name]                 DOB: 28-Jul-1955                    MRN: [id number]      HPI: [name] is a 68 y.o. female patient referred to lipid clinic by Dr. Paulita Boss. PMH is significant for diabetes mellitus type 2, hyperlipidemia, OSA, CKD 3.  Coronary calcium  score of 0 in 2022.  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  LDL-C 86 on atorvastatin  80 mg daily.  Patient did not tolerate ezetimibe . Repatha  was added in Feb 2025. LDL-C came down to 36.  Patient seen today for follow up. She is currently on Ozempic  1mg . Had increased to 2mg  but felt like it was too strong and asked to decrease. She now states she would like to increase back to 2mg .  Doing well on the Repatha . We reviewed the results again   She mentions that her BP is usually in the 150's when she sees a provider. Her husband sometimes makes her check it at home, but overall does not check.  Current Medications: atorvastatin  80 mg daily Intolerances: zetia  (joint/muscle aches) Risk Factors: Diabetes, CKD, elevated LP(a) LDL-C goal: <55 ApoB goal: <60  Exercise: works out with trainer, weight lifting and cardio. Going to start walking  Family History:  Family History  Problem Relation Age of Onset   Diabetes Mother    Hyperlipidemia Mother    Hypertension Father    Heart disease Father    BRCA 1/2 Neg Hx    Breast cancer Neg Hx     Social History: wine occasionally, no tobacco  Labs: Lipid Panel     Component Value Date/Time   CHOL 104 07/11/2023 0932   TRIG 66 07/11/2023 0932   HDL 54 07/11/2023 0932   CHOLHDL 1.9 07/11/2023 0932   CHOLHDL 4.4 04/02/2008 0435   VLDL 19 04/02/2008 0435   LDLCALC 36 07/11/2023 0932   LABVLDL 14 07/11/2023 0932    Past Medical History:  Diagnosis Date   Anemia    Hyperlipidemia    Pre-diabetes    Seasonal allergies    Sleep apnea    Does not use CPAP every night   SVD (spontaneous vaginal delivery)    x 2    Current Outpatient Medications on File Prior to  Visit  Medication Sig Dispense Refill   atorvastatin  (LIPITOR) 80 MG tablet Take 1 tablet (80 mg total) by mouth daily. 90 tablet 3   celecoxib  (CELEBREX ) 100 MG capsule Take 100 mg by mouth daily. Can take 2 tablets if needed     Evolocumab  (REPATHA  SURECLICK) 140 MG/ML SOAJ Inject 140 mg into the skin every 14 (fourteen) days. 2 mL 11   Loratadine (CLARITIN PO) Take by mouth. daily     acetaminophen  (TYLENOL ) 500 MG tablet Take 2 tablets (1,000 mg total) by mouth every 8 (eight) hours. (Patient not taking: Reported on 03/19/2023) 30 tablet 0   Ascorbic Acid (VITAMIN C) 1000 MG tablet Take 1,000 mg by mouth daily.     Cholecalciferol (VITAMIN D3) 25 MCG (1000 UT) CAPS Take by mouth. 1 tab daily     diphenhydrAMINE  (BENADRYL ) 25 MG tablet Take 1 tablet (25 mg total) by mouth every 6 (six) hours as needed. 30 tablet 0   EPINEPHrine  0.3 mg/0.3 mL IJ SOAJ injection Inject 0.3 mg into the muscle as needed for anaphylaxis. 1 each 0   fluticasone  (  FLONASE ) 50 MCG/ACT nasal spray Place 1 spray into both nostrils daily as needed for allergies (allergies.). (Patient not taking: Reported on 07/18/2023)     No current facility-administered medications on file prior to visit.    Allergies  Allergen Reactions   Contrast Media [Iodinated Contrast Media] Anaphylaxis    Throat swelling, required epinephrine  and benadryl  a long with monitoring in the ED.   Ibuprofen  Rash   Naproxen Sodium Rash    Assessment/Plan:  1. Hyperlipidemia -  Elevated blood pressure reading Assessment: Blood pressure elevated in clinic today She reports that it typically is in the 150's at MD office She stepped on a toothpick and her foot is in a lot of pain Not checking BP at home  Plan: I have asked patient to start checking her blood pressure at home She is to bring those readings and home machine with her to clinic for follow up Follow up in office in 1 month  Mixed hyperlipidemia Assessment: LDL-C is at goal Will  keep her on list for potential LP(a) trial   Plan: Continue atorvastatin  80mg  daily and Repatha  140mg  q 14 days   Class 1 obesity due to excess calories without serious comorbidity with body mass index (BMI) of 30.0 to 30.9 in adult Plan: Increase ozempic  to 2mg  per patient request     Thank you,  Heliodoro Domagalski D Adaline Trejos, Pharm.Monika Annas, CPP [facility] of [facility] [address], [place], [place] [address]  Phone: [phone]; Fax: [phone]

## 2023-07-18 NOTE — Assessment & Plan Note (Signed)
 Assessment: Blood pressure elevated in clinic today She reports that it typically is in the 150's at MD office She stepped on a toothpick and her foot is in a lot of pain Not checking BP at home  Plan: I have asked patient to start checking her blood pressure at home She is to bring those readings and home machine with her to clinic for follow up Follow up in office in 1 month

## 2023-07-18 NOTE — Patient Instructions (Addendum)
 Your blood pressure goal is < 130/46mmHg  Please check your blood pressure once or twice a day Please bring you list of blood pressure readings and blood pressure machine with you to your next visit.    Important lifestyle changes to control high blood pressure  Intervention  Effect on the BP   Weight loss Weight loss is one of the most effective lifestyle changes for controlling blood pressure. If you're overweight or obese, losing even a small amount of weight can help reduce blood pressure.    Blood pressure can decrease by 1 millimeter of mercury (mmHg) with each kilogram (about 2.2 pounds) of weight lost.   Exercise regularly As a general goal, aim for 30 minutes of moderate physical activity every day.    Regular physical activity can lower blood pressure by 5 - 8 mmHg.   Eat a healthy diet Eat a diet rich in whole grains, fruits, vegetables, lean meat, and low-fat dairy products. Limit processed foods, saturated fat, and sweets.    A heart-healthy diet can lower high blood pressure by 10 mmHg.   Reduce salt (sodium) in your diet Aim for 000mg  of sodium each day. Avoid deli meats, canned food, and frozen microwave meals which are high in sodium.     Limiting sodium can reduce blood pressure by 5 mmHg.   Limit alcohol One drink equals 12 ounces of beer, 5 ounces of wine, or 1.5 ounces of 80-proof liquor.    Limiting alcohol to < 1 drink a day for women or < 2 drinks a day for men can help lower blood pressure by about 4 mmHg.   To check your pressure at home you will need to:   Sit up in a chair, with feet flat on the floor and back supported. Do not cross your ankles or legs. Rest your left arm so that the cuff is about heart level. If the cuff goes on your upper arm, then just relax your arm on the table, arm of the chair, or your lap. If you have a wrist cuff, hold your wrist against your chest at heart level. Place the cuff snugly around your arm, about 1 inch  above the crease of your elbow. The cords should be inside the groove of your elbow.  Sit quietly, with the cuff in place, for about 5 minutes. Then press the power button to start a reading. Do not talk or move while the reading is taking place.  Record your readings on a sheet of paper. Although most cuffs have a memory, it is often easier to see a pattern developing when the numbers are all in front of you.  You can repeat the reading after 1-3 minutes if it is recommended.   Make sure your bladder is empty and you have not had caffeine or tobacco within the last 30 minutes   Always bring your blood pressure log with you to your appointments. If you have not brought your monitor in to be double checked for accuracy, please bring it to your next appointment.   You can find a list of validated (accurate) blood pressure cuffs at: validatebp.org

## 2023-07-22 ENCOUNTER — Ambulatory Visit: Payer: Self-pay | Admitting: Endocrinology

## 2023-07-24 ENCOUNTER — Ambulatory Visit (INDEPENDENT_AMBULATORY_CARE_PROVIDER_SITE_OTHER): Payer: Medicare Other | Admitting: Endocrinology

## 2023-07-24 ENCOUNTER — Encounter: Payer: Self-pay | Admitting: Endocrinology

## 2023-07-24 VITALS — BP 136/70 | HR 83 | Resp 16 | Ht 62.5 in | Wt 187.6 lb

## 2023-07-24 DIAGNOSIS — E041 Nontoxic single thyroid nodule: Secondary | ICD-10-CM | POA: Diagnosis not present

## 2023-07-24 DIAGNOSIS — E042 Nontoxic multinodular goiter: Secondary | ICD-10-CM

## 2023-07-24 NOTE — Patient Instructions (Addendum)
 Referred to radiology for biopsy of left thyroid  nodule.

## 2023-07-24 NOTE — Progress Notes (Signed)
 Outpatient Endocrinology Note [place] Giavonni Fonder, MD  07/24/23  Patient's Name: [name]    DOB: 02-17-1955    MRN: [id number]  REASON OF VISIT: Follow-up for thyroid  nodules   PCP: Arturo Bing, MD  HISTORY OF PRESENT ILLNESS:   [name] is a 68 y.o. old female with past medical history as listed below is presented for follow-up for thyroid  nodules.  Pertinent Thyroid  History:  Patient noticed left thyroid  enlargement in March 2024 and had ultrasound thyroid  in May 18, 2022 at [facility] showed left complex thyroid  nodule measuring 4.2 cm.  Patient was referred and initial endocrinology visit was in December 2024.    Ultrasound thyroid  is scanned into media report reviewed and noted as follows.  Major letter received and scanned into the system.    Right lobe measured 4.3 x 2.2 x 2.6 cm, left lobe measures 6.0 x 3.7 x 3.9 cm.  Isthmus measures 0.37 cm.  0.8 cm hypoechoic right upper pole nodule.  5 mm right lower pole colloid cyst.  4.2 cm complex left midpole nodule.  Patient is known to have thyroid  nodule, history of ultrasound thyroid  monitoring from 2008-2012.  Ultrasound in March 2012 showed left complex nodule measuring 1.4 x 1.0 x 1.3 cm with cystic degeneration.  Left hypoechoic nodule measuring 0.6 cm.  She had ultrasound thyroid  in 2018, 2009, 2017 and 2012 showing more than 1 cm left thyroid  nodule and other smaller bilateral thyroid  nodules, images reviewed.  Family history of thyroid  disorder.  Mother had  thyroidectomy requiring levothyroxine.  She has daughter with enlarged thyroid  and weight loss, ?  hyperthyroidism, on thyroid  medication, does not know the details.  No family history of thyroid  cancer.  Patient has noticed left thyroid  nodule in March 2024 and it has been noticeable probably slightly bigger as her friends are also noticing since then.  She denies dysphagia, neck discomfort, choking or change in voice.  She is euthyroid not  on thyroid  medication.  # General 6 of 2025 she underwent FNA of left complex thyroid  nodule measuring 4.2 cm cytology was scant follicular epithelium present Bethesda category 1, satisfactory for evaluation.  FINAL MICROSCOPIC DIAGNOSIS:  - Scant follicular epithelium present (Bethesda category I)   SPECIMEN ADEQUACY:  Satisfactory for evaluation   Interval history  Patient presented for the follow-up of thyroid  nodules.  Patient had FNA of left complex thyroid  nodule with the scant epithelium Bethesda category 1 with satisfactory for evaluation.    Patient has no hypo and hyperthyroid symptoms.  She denies dysphagia, difficulty in breathing in any position.  No neck discomfort.  She thinks the thyroid  nodule may have been slightly bigger compared to last visit.  Patient had repeat ultrasound thyroid  to monitor couple of days ago reviewed and compared with images of ultrasound in April 2024.   Left complex thyroid  nodule measuring 5.9 cm difficult to compare with prior ultrasound grossly seems to be stable however may have mildly increased in size. It is differently measured and this nodule was measuring 4.2 cm in 1 year ago.  CLINICAL DATA:  Goiter. Recent prior fine-needle aspiration biopsy of left mid thyroid  nodule in January of this year.   EXAM: THYROID  ULTRASOUND   TECHNIQUE: Ultrasound examination of the thyroid  gland and adjacent soft tissues was performed.   COMPARISON:  Prior thyroid  ultrasound 05/18/2022   FINDINGS: Parenchymal Echotexture: Mildly heterogenous   Isthmus: 0.5 cm   Right lobe: 4.8 x 2.4 x 2.0 cm  Left lobe: 6.1 x 4.5 x 4.4 cm   _________________________________________________________   Estimated total number of nodules >/= 1 cm: 2   Number of spongiform nodules >/=  2 cm not described below (TR1): 0   Number of mixed cystic and solid nodules >/= 1.5 cm not described below (TR2): 0    _________________________________________________________   Nodule # 1: Mixed cystic and solid nodule in the right upper gland measures 1.0 x 0.8 x 0.6 cm. The solid component is hypoechoic. Overall findings are consistent with TI-RADS category 3. Given size (<1.4 cm) and appearance, this nodule does NOT meet TI-RADS criteria for biopsy or dedicated follow-up.   Nodule # 2: Tiny isoechoic solid nodule in the right mid gland measures 0.5 cm. Given size (<1.4 cm) and appearance, this nodule does NOT meet TI-RADS criteria for biopsy or dedicated follow-up.   Nodule # 3: Previously biopsied mixed cystic and solid mass occupying the majority of the left mid and lower gland is difficult to measure with precision. The mass measures grossly 5.9 by 4.6 x 3.7 cm.   IMPRESSION: 1. Large mixed cystic and solid mass occupying the majority of the left mid and lower thyroid  gland was previously biopsied earlier this year. Recommend correlation with prior biopsy results. Due to the large size of the lesion, comparison measurements are challenging. 2. Incidental note is made of additional small nodules in the right gland which remain unchanged and do not meet criteria for further follow-up.   The above is in keeping with the ACR TI-RADS recommendations - J Am Coll Radiol 2017;14:587-595.    REVIEW OF SYSTEMS:  As per history of present illness.   PAST MEDICAL HISTORY: Past Medical History:  Diagnosis Date   Anemia    Hyperlipidemia    Pre-diabetes    Seasonal allergies    Sleep apnea    Does not use CPAP every night   SVD (spontaneous vaginal delivery)    x 2    PAST SURGICAL HISTORY: Past Surgical History:  Procedure Laterality Date   COLONOSCOPY  2008   DILATION AND CURETTAGE OF UTERUS  04/2011   POLYPECTOMY  05/28/2011   Procedure: POLYPECTOMY;  Surgeon: Milford Allegra, MD;  Location: [facility];  Service: Gynecology;  Laterality: N/A;   ROBOTIC ASSISTED TOTAL HYSTERECTOMY  Bilateral 03/11/2013   Procedure: ROBOTIC ASSISTED TOTAL HYSTERECTOMY WITH BILATERAL SALPINGO-OOPHORECTOMY;  Surgeon: Kandra Orn, MD;  Location: [facility];  Service: Gynecology;  Laterality: Bilateral;   SHOULDER ARTHROSCOPY Right 2015   svd     x 2   TONSILLECTOMY     TOTAL KNEE REVISION Right 06/11/2019   Procedure: TOTAL KNEE REVISION;  Surgeon: Claiborne Crew, MD;  Location: [facility];  Service: Orthopedics;  Laterality: Right;  120 mins   TUBAL LIGATION      ALLERGIES: Allergies  Allergen Reactions   Contrast Media [Iodinated Contrast Media] Anaphylaxis    Throat swelling, required epinephrine  and benadryl  a long with monitoring in the ED.   Ibuprofen  Rash   Naproxen Sodium Rash    FAMILY HISTORY:  Family History  Problem Relation Age of Onset   Diabetes Mother    Hyperlipidemia Mother    Hypertension Father    Heart disease Father    BRCA 1/2 Neg Hx    Breast cancer Neg Hx     SOCIAL HISTORY: Social History   Socioeconomic History   Marital status: Married    Spouse name: Not on file   Number of children: Not  on file   Years of education: Not on file   Highest education level: Not on file  Occupational History   Not on file  Tobacco Use   Smoking status: Never   Smokeless tobacco: Never  Vaping Use   Vaping status: Never Used  Substance and Sexual Activity   Alcohol use: Yes    Comment: wine - weekends   Drug use: No   Sexual activity: Yes    Birth control/protection: Surgical  Other Topics Concern   Not on file  Social History Narrative   Not on file   Social Drivers of Health   Financial Resource Strain: Not on file  Food Insecurity: Low Risk  (01/08/2023)   Received from [facility]   Hunger Vital Sign    Worried About Running Out of Food in the Last Year: Never true    Ran Out of Food in the Last Year: Never true  Transportation Needs: No Transportation Needs (01/08/2023)   Received from [facility]    In the past 12  months, has lack of reliable transportation kept you from medical appointments, meetings, work or from getting things needed for daily living? : No  Physical Activity: Not on file  Stress: Not on file  Social Connections: Not on file    MEDICATIONS:  Current Outpatient Medications  Medication Sig Dispense Refill   acetaminophen  (TYLENOL ) 500 MG tablet Take 2 tablets (1,000 mg total) by mouth every 8 (eight) hours. 30 tablet 0   Ascorbic Acid (VITAMIN C) 1000 MG tablet Take 1,000 mg by mouth daily.     atorvastatin  (LIPITOR) 80 MG tablet Take 1 tablet (80 mg total) by mouth daily. 90 tablet 3   celecoxib  (CELEBREX ) 100 MG capsule Take 100 mg by mouth daily. Can take 2 tablets if needed     Cholecalciferol (VITAMIN D3) 25 MCG (1000 UT) CAPS Take by mouth. 1 tab daily     diphenhydrAMINE  (BENADRYL ) 25 MG tablet Take 1 tablet (25 mg total) by mouth every 6 (six) hours as needed. 30 tablet 0   EPINEPHrine  0.3 mg/0.3 mL IJ SOAJ injection Inject 0.3 mg into the muscle as needed for anaphylaxis. 1 each 0   Evolocumab  (REPATHA  SURECLICK) 140 MG/ML SOAJ Inject 140 mg into the skin every 14 (fourteen) days. 2 mL 11   fluticasone  (FLONASE ) 50 MCG/ACT nasal spray Place 1 spray into both nostrils daily as needed for allergies (allergies.).     Loratadine (CLARITIN PO) Take by mouth. daily     Semaglutide , 2 MG/DOSE, (OZEMPIC , 2 MG/DOSE,) 8 MG/3ML SOPN Inject 2 mg into the skin once a week. 3 mL 11   No current facility-administered medications for this visit.    PHYSICAL EXAM: Vitals:   07/24/23 1307 07/24/23 1308  BP: (!) 142/70 136/70  Pulse: 83   Resp: 16   SpO2: 96%   Weight: 187 lb 9.6 oz (85.1 kg)   Height: 5' 2.5 (1.588 m)     Body mass index is 33.77 kg/m.  Wt Readings from Last 3 Encounters:  07/24/23 187 lb 9.6 oz (85.1 kg)  01/23/23 185 lb 3.2 oz (84 kg)  10/02/22 191 lb 3.2 oz (86.7 kg)     General: Well developed, well nourished female in no apparent distress.  HEENT:  AT/[place], no external lesions. Hearing intact to the spoken word Eyes: EOMI. No stare, proptosis. Conjunctiva clear and no icterus. Neck: Trachea midline, neck supple with appreciable thyromegaly L > R or no  lymphadenopathy and palpable left thyroid  nodule, mobile, non tender ~ 4 cm.  Lungs: Clear to auscultation, no wheeze. Respirations not labored Heart: S1S2, Regular in rate and rhythm.  Abdomen: Soft, non tender, non distended Neurologic: Alert, oriented, normal speech, deep tendon biceps reflexes normal. Extremities: No pedal pitting edema, no tremors of outstretched hands Skin: Warm, color good.  Psychiatric: Does not appear depressed or anxious  PERTINENT HISTORIC LABORATORY AND IMAGING STUDIES:  All pertinent laboratory results were reviewed. Please see HPI also for further details.   ASSESSMENT / PLAN  1. Multiple thyroid  nodules   2. Left thyroid  nodule     -Patient is known to have thyroid  nodules, mostly smaller and left thyroid  nodule around 1 cm since 2008 -2012 timeframe.  In March 2024 she noticed larger left thyroid  nodule and had ultrasound thyroid  in April 2024 showed left thyroid  nodule complex measuring 4.2 cm.  There are other small bilateral thyroid  nodules. -Patient denies neck compressive symptoms.  She is euthyroid.  Not on thyroid  medication.  Family history of thyroid  disorder but no family Struve thyroid  cancer.  No radiation exposure. - She underwent FNA of left complex thyroid  nodule with scanty follicular epithelium and satisfactory for evaluation. - This month patient had ultrasound thyroid  left complex thyroid  nodule measuring 5.9 cm in comparison with ultrasound in April 2024 difficult to compare however grossly stable may be mildly increased in size taking to account of measurement variability, this nodule was measured 4.2 cm in previous ultrasound. -Patient wants to consider for thyroid  surgery however not sure at this time.  Discussed about indication of  thyroid  surgery including thyroid  cancer neck compressive symptoms.  She does not have both at this time.  I recommended to monitor with serial ultrasound.  Plan: - Repeat FNA of left complex nodule. - After cytology report will discuss with patient again regarding monitoring with serial ultrasound versus considering for left lobectomy. - If the cytology is benign and we decide on monitoring we will plan to repeat ultrasound thyroid  in 1 year. - Follow-up with endocrinology in 1 year.  Will reschedule as needed based on the above plan.  Diagnoses and all orders for this visit:  Multiple thyroid  nodules  Left thyroid  nodule -     US  FNA BX THYROID  1ST LESION AFIRMA; Future     DISPOSITION Follow up in clinic in 12 months suggested.  All questions answered and patient verbalized understanding of the plan.  Iraq Santos Hardwick, MD [facility] Endocrinology [facility] [address] [place], [address] [place], [place] [address] Phone # [phone]  At least part of this note was generated using voice recognition software. Inadvertent word errors may have occurred, which were not recognized during the proofreading process.

## 2023-08-02 ENCOUNTER — Telehealth: Payer: Self-pay

## 2023-08-02 NOTE — Telephone Encounter (Signed)
 Spoke with Desiree and [facility], explained to her the rationale behind wanting another biopsy as directed by MD. Per staff she has updated the note.

## 2023-08-02 NOTE — Telephone Encounter (Signed)
 Staff called from [facility] to follow up with MD about new biopsy ordered.

## 2023-08-12 ENCOUNTER — Ambulatory Visit: Payer: Self-pay | Admitting: Endocrinology

## 2023-08-12 ENCOUNTER — Ambulatory Visit
Admission: RE | Admit: 2023-08-12 | Discharge: 2023-08-12 | Disposition: A | Discharge status: Home / Self Care | Source: Ambulatory Visit | Attending: Endocrinology

## 2023-08-12 ENCOUNTER — Other Ambulatory Visit (HOSPITAL_COMMUNITY)
Admission: RE | Admit: 2023-08-12 | Discharge: 2023-08-12 | Disposition: A | Discharge status: Home / Self Care | Source: Ambulatory Visit | Attending: Student | Admitting: Student

## 2023-08-12 DIAGNOSIS — E041 Nontoxic single thyroid nodule: Secondary | ICD-10-CM | POA: Diagnosis present

## 2023-08-14 LAB — CYTOLOGY - NON PAP

## 2023-08-15 ENCOUNTER — Ambulatory Visit: Payer: Self-pay | Admitting: Internal Medicine

## 2023-08-15 DIAGNOSIS — E041 Nontoxic single thyroid nodule: Secondary | ICD-10-CM

## 2023-08-15 DIAGNOSIS — E042 Nontoxic multinodular goiter: Secondary | ICD-10-CM

## 2023-08-19 ENCOUNTER — Ambulatory Visit: Attending: Cardiology | Admitting: Pharmacist

## 2023-08-19 VITALS — BP 152/86 | HR 80

## 2023-08-19 DIAGNOSIS — R030 Elevated blood-pressure reading, without diagnosis of hypertension: Secondary | ICD-10-CM | POA: Diagnosis present

## 2023-08-19 MED ORDER — AMLODIPINE BESYLATE 2.5 MG PO TABS: 2.5000 mg | ORAL_TABLET | Freq: Every day | ORAL | 3 refills | Status: DC

## 2023-08-19 NOTE — Progress Notes (Signed)
 Patient ID: [name]                 DOB: [birth year]                    MRN: [id number]      HPI: [name] is a 68 y.o. female patient referred to PharmD clinic by Dr. Santo. PMH is significant for diabetes mellitus type 2, hyperlipidemia, OSA, CKD 3.  Coronary calcium  score of 0 in 2022.  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  LDL-C 86 on atorvastatin  80 mg daily.  Patient did not tolerate ezetimibe . Repatha  was added in Feb 2025. LDL-C came down to 36.  I saw patient 07/18/23 for f/u on Ozempic  and Repatha . She mentioned her BP usually high when she see MD. She was not checking at home. On no BP medications. I asked her to check BP at home and f/u with me in 1 month.   Patient presents today to clinic.  She brings in her home blood pressure cuff.  She reports that the past week readings have been about 120/80.  However when checking her blood pressure cuff she did not put on the cuff correctly and the cord was in the back.  She also reports home readings into 140-150's systolic.  Home blood pressure cuff found to be accurate.  Home OMRON: 153/91 Home OMRON: 156/86 Clinic: 152/86  She gets about 6-7 hours of sleep. She does wear her CPAP consistently, even when she naps. Has not been exercising as much recently as about a month ago she stepped on a toothpick and part of it was lodged in her foot.  It became infected, about 2 weeks ago it was removed and is starting to feel a lot better.  She started back with her exercise recently.  Current BP Medications: none Blood pressure goal: <130/80  Exercise: works out with trainer, weight lifting and cardio. Going to start walking  Diet: Doesn't cook with salt,   Family History:  Family History  Problem Relation Age of Onset   Diabetes Mother    Hyperlipidemia Mother    Hypertension Father    Heart disease Father    BRCA 1/2 Neg Hx    Breast cancer Neg Hx     Social History: wine occasionally (1 on weekends), no  tobacco  `  Labs: Lipid Panel     Component Value Date/Time   CHOL 104 07/11/2023 0932   TRIG 66 07/11/2023 0932   HDL 54 07/11/2023 0932   CHOLHDL 1.9 07/11/2023 0932   CHOLHDL 4.4 04/02/2008 0435   VLDL 19 04/02/2008 0435   LDLCALC 36 07/11/2023 0932   LABVLDL 14 07/11/2023 0932    Past Medical History:  Diagnosis Date   Anemia    Hyperlipidemia    Pre-diabetes    Seasonal allergies    Sleep apnea    Does not use CPAP every night   SVD (spontaneous vaginal delivery)    x 2    Current Outpatient Medications on File Prior to Visit  Medication Sig Dispense Refill   acetaminophen  (TYLENOL ) 500 MG tablet Take 2 tablets (1,000 mg total) by mouth every 8 (eight) hours. 30 tablet 0   Ascorbic Acid (VITAMIN C) 1000 MG tablet Take 1,000 mg by mouth daily.     atorvastatin  (LIPITOR) 80 MG tablet Take 1 tablet (80 mg total) by mouth daily. 90 tablet 3   celecoxib  (CELEBREX ) 100 MG capsule Take 100 mg by mouth daily. Can  take 2 tablets if needed     Cholecalciferol (VITAMIN D3) 25 MCG (1000 UT) CAPS Take by mouth. 1 tab daily     diphenhydrAMINE  (BENADRYL ) 25 MG tablet Take 1 tablet (25 mg total) by mouth every 6 (six) hours as needed. 30 tablet 0   EPINEPHrine  0.3 mg/0.3 mL IJ SOAJ injection Inject 0.3 mg into the muscle as needed for anaphylaxis. 1 each 0   Evolocumab  (REPATHA  SURECLICK) 140 MG/ML SOAJ Inject 140 mg into the skin every 14 (fourteen) days. 2 mL 11   fluticasone  (FLONASE ) 50 MCG/ACT nasal spray Place 1 spray into both nostrils daily as needed for allergies (allergies.).     Loratadine (CLARITIN PO) Take by mouth. daily     Semaglutide , 2 MG/DOSE, (OZEMPIC , 2 MG/DOSE,) 8 MG/3ML SOPN Inject 2 mg into the skin once a week. 3 mL 11   No current facility-administered medications on file prior to visit.    Allergies  Allergen Reactions   Contrast Media [Iodinated Contrast Media] Anaphylaxis    Throat swelling, required epinephrine  and benadryl  a long with monitoring in  the ED.   Ibuprofen  Rash   Naproxen Sodium Rash    Assessment/Plan:  1. Hyperlipidemia -  Elevated blood pressure reading Assessment: Blood pressure readings at home and in clinic are elevated consistent with essential hypertension Patient has been exercising with just a few weeks hiatus due to foot injury She does not cook with salt Does not pay attention to food labels, suggested she start paying more attention to the amount of sodium. Gets about 6 to 7 hours of sleep nightly  Plan: Start amlodipine  2.5 mg daily Follow-up in 4 to 6 weeks Reviewed proper technique to checking blood pressure Advised patient to start checking blood pressure at home and bring in her log to next visit   Thank you,  Braidon Chermak D Roslind Michaux, Pharm.[name], CPP [facility] of [facility] [address] [place], [place] [address]  Phone: [phone]; Fax: [phone]

## 2023-08-19 NOTE — Assessment & Plan Note (Signed)
 Assessment: Blood pressure readings at home and in clinic are elevated consistent with essential hypertension Patient has been exercising with just a few weeks hiatus due to foot injury She does not cook with salt Does not pay attention to food labels, suggested she start paying more attention to the amount of sodium. Gets about 6 to 7 hours of sleep nightly  Plan: Start amlodipine  2.5 mg daily Follow-up in 4 to 6 weeks Reviewed proper technique to checking blood pressure Advised patient to start checking blood pressure at home and bring in her log to next visit

## 2023-08-19 NOTE — Patient Instructions (Addendum)
 Your blood pressure goal is < 130/72mmHg   Please start taking amlodipine  2.5mg  daily Please check blood pressure at home and bring in your list of readings to your next appointment  Important lifestyle changes to control high blood pressure  Intervention  Effect on the BP   Weight loss Weight loss is one of the most effective lifestyle changes for controlling blood pressure. If you're overweight or obese, losing even a small amount of weight can help reduce blood pressure.    Blood pressure can decrease by 1 millimeter of mercury (mmHg) with each kilogram (about 2.2 pounds) of weight lost.   Exercise regularly As a general goal, aim for 30 minutes of moderate physical activity every day.    Regular physical activity can lower blood pressure by 5 - 8 mmHg.   Eat a healthy diet Eat a diet rich in whole grains, fruits, vegetables, lean meat, and low-fat dairy products. Limit processed foods, saturated fat, and sweets.    A heart-healthy diet can lower high blood pressure by 10 mmHg.   Reduce salt (sodium) in your diet Aim for 000mg  of sodium each day. Avoid deli meats, canned food, and frozen microwave meals which are high in sodium.     Limiting sodium can reduce blood pressure by 5 mmHg.   Limit alcohol One drink equals 12 ounces of beer, 5 ounces of wine, or 1.5 ounces of 80-proof liquor.    Limiting alcohol to < 1 drink a day for women or < 2 drinks a day for men can help lower blood pressure by about 4 mmHg.   To check your pressure at home you will need to:   Sit up in a chair, with feet flat on the floor and back supported. Do not cross your ankles or legs. Rest your left arm so that the cuff is about heart level. If the cuff goes on your upper arm, then just relax your arm on the table, arm of the chair, or your lap. If you have a wrist cuff, hold your wrist against your chest at heart level. Place the cuff snugly around your arm, about 1 inch above the crease of  your elbow. The cords should be inside the groove of your elbow.  Sit quietly, with the cuff in place, for about 5 minutes. Then press the power button to start a reading. Do not talk or move while the reading is taking place.  Record your readings on a sheet of paper. Although most cuffs have a memory, it is often easier to see a pattern developing when the numbers are all in front of you.  You can repeat the reading after 1-3 minutes if it is recommended.   Make sure your bladder is empty and you have not had caffeine or tobacco within the last 30 minutes   Always bring your blood pressure log with you to your appointments. If you have not brought your monitor in to be double checked for accuracy, please bring it to your next appointment.   You can find a list of validated (accurate) blood pressure cuffs at: validatebp.org

## 2023-08-22 ENCOUNTER — Other Ambulatory Visit: Payer: Self-pay | Admitting: Internal Medicine

## 2023-08-22 DIAGNOSIS — Z1231 Encounter for screening mammogram for malignant neoplasm of breast: Secondary | ICD-10-CM

## 2023-09-12 ENCOUNTER — Emergency Department (HOSPITAL_COMMUNITY)
Admission: EM | Admit: 2023-09-12 | Discharge: 2023-09-12 | Disposition: A | Discharge status: Home / Self Care | Attending: Student | Admitting: Student

## 2023-09-12 ENCOUNTER — Other Ambulatory Visit: Payer: Self-pay

## 2023-09-12 ENCOUNTER — Emergency Department (HOSPITAL_COMMUNITY)

## 2023-09-12 ENCOUNTER — Encounter (HOSPITAL_COMMUNITY): Payer: Self-pay

## 2023-09-12 DIAGNOSIS — Z79899 Other long term (current) drug therapy: Secondary | ICD-10-CM | POA: Diagnosis not present

## 2023-09-12 DIAGNOSIS — I700 Atherosclerosis of aorta: Secondary | ICD-10-CM | POA: Diagnosis not present

## 2023-09-12 DIAGNOSIS — R1031 Right lower quadrant pain: Secondary | ICD-10-CM | POA: Insufficient documentation

## 2023-09-12 DIAGNOSIS — B9689 Other specified bacterial agents as the cause of diseases classified elsewhere: Secondary | ICD-10-CM | POA: Insufficient documentation

## 2023-09-12 DIAGNOSIS — N760 Acute vaginitis: Secondary | ICD-10-CM | POA: Diagnosis not present

## 2023-09-12 DIAGNOSIS — R300 Dysuria: Secondary | ICD-10-CM | POA: Diagnosis present

## 2023-09-12 LAB — COMPREHENSIVE METABOLIC PANEL WITH GFR
ALT: 25 U/L (ref 0–44)
AST: 21 U/L (ref 15–41)
Albumin: 3.9 g/dL (ref 3.5–5.0)
Alkaline Phosphatase: 69 U/L (ref 38–126)
Anion gap: 10 (ref 5–15)
BUN: 17 mg/dL (ref 8–23)
CO2: 25 mmol/L (ref 22–32)
Calcium: 9.5 mg/dL (ref 8.9–10.3)
Chloride: 105 mmol/L (ref 98–111)
Creatinine, Ser: 0.96 mg/dL (ref 0.44–1.00)
GFR, Estimated: >60 mL/min (ref >60)
Glucose, Bld: 91 mg/dL (ref 70–99)
Potassium: 4.3 mmol/L (ref 3.5–5.1)
Sodium: 140 mmol/L (ref 135–145)
Total Bilirubin: 0.4 mg/dL (ref 0.0–1.2)
Total Protein: 7.5 g/dL (ref 6.5–8.1)

## 2023-09-12 LAB — CBC WITH DIFFERENTIAL/PLATELET
Abs Immature Granulocytes: 0.02 K/uL (ref 0.00–0.07)
Basophils Absolute: 0 K/uL (ref 0.0–0.1)
Basophils Relative: 1 %
Eosinophils Absolute: 0.2 K/uL (ref 0.0–0.5)
Eosinophils Relative: 4 %
HCT: 40.4 % (ref 36.0–46.0)
Hemoglobin: 12.7 g/dL (ref 12.0–15.0)
Immature Granulocytes: 0 %
Lymphocytes Relative: 38 %
Lymphs Abs: 2.3 K/uL (ref 0.7–4.0)
MCH: 25.9 pg — ABNORMAL LOW (ref 26.0–34.0)
MCHC: 31.4 g/dL (ref 30.0–36.0)
MCV: 82.3 fL (ref 80.0–100.0)
Monocytes Absolute: 0.7 K/uL (ref 0.1–1.0)
Monocytes Relative: 11 %
Neutro Abs: 2.8 K/uL (ref 1.7–7.7)
Neutrophils Relative %: 46 %
Platelets: 285 K/uL (ref 150–400)
RBC: 4.91 MIL/uL (ref 3.87–5.11)
RDW: 15.6 % — ABNORMAL HIGH (ref 11.5–15.5)
WBC: 6 K/uL (ref 4.0–10.5)
nRBC: 0 % (ref 0.0–0.2)

## 2023-09-12 LAB — URINALYSIS, ROUTINE W REFLEX MICROSCOPIC
Bacteria, UA: NONE SEEN
Bilirubin Urine: NEGATIVE
Glucose, UA: NEGATIVE mg/dL
Hgb urine dipstick: NEGATIVE
Ketones, ur: NEGATIVE mg/dL
Nitrite: NEGATIVE
Protein, ur: NEGATIVE mg/dL
Specific Gravity, Urine: 1.017 (ref 1.005–1.030)
pH: 7 (ref 5.0–8.0)

## 2023-09-12 LAB — LIPASE, BLOOD: Lipase: 35 U/L (ref 11–51)

## 2023-09-12 LAB — WET PREP, GENITAL
Sperm: NONE SEEN
Trich, Wet Prep: NONE SEEN
WBC, Wet Prep HPF POC: >=10 — AB (ref <10)
Yeast Wet Prep HPF POC: NONE SEEN

## 2023-09-12 MED ORDER — METRONIDAZOLE 500 MG PO TABS: 500.0000 mg | ORAL_TABLET | Freq: Two times a day (BID) | ORAL | 0 refills | Status: DC

## 2023-09-12 NOTE — ED Triage Notes (Signed)
 Pt reports with off and on abdominal pain x 2 weeks. Pt reports being on abts for the past 2 weeks for a UTI and having something in her foot.

## 2023-09-12 NOTE — ED Provider Notes (Signed)
 [place] EMERGENCY DEPARTMENT AT [facility] Provider Note   CSN: [id number] Arrival date & time: 09/12/23  1253     Patient presents with: Abdominal Pain   [name] is a 68 y.o. female.   The history is provided by the patient and medical records. No language interpreter was used.  Abdominal Pain    68 year old female with history of anemia, hyperlipidemia presenting with complaint of abdominal pain.  Patient report about a month ago she excellently stepped on a toothpick and injured her foot subsequently developed foot infection.  As she was placed on antibiotic for 2 weeks.  While on antibiotics she developed a urinary tract infection for which she was placed on additional antibiotics and then subsequently developed a yeast infection and she was placed on antifungal medication.  She is here today stating that she still having recurrent pain to her lower abdomen.  Pain is sharp throbbing, more noticeable with movement and mild in severity.  Left foot infection has improved.  She is also requesting to be tested to make sure her UTI and yeast infection has resolved.  She does endorse some mild dysuria but that has since improved.  She does not endorse any fever or chills no nausea vomiting diarrhea no constipation no vaginal bleeding no rash.  No postprandial pain.  Prior to Admission medications   Medication Sig Start Date End Date Taking? Authorizing Provider  acetaminophen  (TYLENOL ) 500 MG tablet Take 2 tablets (1,000 mg total) by mouth every 8 (eight) hours. 06/12/19   Patti Rosina SAUNDERS, PA-C  amLODipine  (NORVASC ) 2.5 MG tablet Take 1 tablet (2.5 mg total) by mouth daily. 08/19/23   Santo Stanly LABOR, MD  Ascorbic Acid (VITAMIN C) 1000 MG tablet Take 1,000 mg by mouth daily.    [provider]  atorvastatin  (LIPITOR) 80 MG tablet Take 1 tablet (80 mg total) by mouth daily. 01/02/23   Chandrasekhar, Stanly A, MD  celecoxib  (CELEBREX ) 100 MG capsule Take 100  mg by mouth daily. Can take 2 tablets if needed    [provider]  Cholecalciferol (VITAMIN D3) 25 MCG (1000 UT) CAPS Take by mouth. 1 tab daily    [provider]  diphenhydrAMINE  (BENADRYL ) 25 MG tablet Take 1 tablet (25 mg total) by mouth every 6 (six) hours as needed. 08/26/20   Aberman, Caroline C, PA-C  EPINEPHrine  0.3 mg/0.3 mL IJ SOAJ injection Inject 0.3 mg into the muscle as needed for anaphylaxis. 08/26/20   Aberman, Caroline C, PA-C  Evolocumab  (REPATHA  SURECLICK) 140 MG/ML SOAJ Inject 140 mg into the skin every 14 (fourteen) days. 03/20/23   Santo Stanly LABOR, MD  fluticasone  (FLONASE ) 50 MCG/ACT nasal spray Place 1 spray into both nostrils daily as needed for allergies (allergies.). 06/04/16   [provider]  Loratadine (CLARITIN PO) Take by mouth. daily    [provider]  Semaglutide , 2 MG/DOSE, (OZEMPIC , 2 MG/DOSE,) 8 MG/3ML SOPN Inject 2 mg into the skin once a week. 07/18/23   Santo Stanly LABOR, MD    Allergies: Contrast media [iodinated contrast media], Ibuprofen , and Naproxen sodium    Review of Systems  Gastrointestinal:  Positive for abdominal pain.  All other systems reviewed and are negative.   Updated Vital Signs BP (!) 154/72   Pulse 97   Temp 98.2 F (36.8 C)   Resp 15   Ht 5' 2.5 (1.588 m)   Wt 85.1 kg   SpO2 99%   BMI 33.77 kg/m  Physical Exam Vitals and nursing note reviewed.  Constitutional:      General: She is not in acute distress.    Appearance: She is well-developed.  HENT:     Head: Atraumatic.  Eyes:     Conjunctiva/sclera: Conjunctivae normal.  Cardiovascular:     Rate and Rhythm: Normal rate and regular rhythm.     Heart sounds: Normal heart sounds.  Pulmonary:     Effort: Pulmonary effort is normal.     Breath sounds: Normal breath sounds.  Abdominal:     Palpations: Abdomen is soft.     Tenderness: There is abdominal tenderness in the right lower quadrant. There is no right CVA  tenderness or left CVA tenderness.     Hernia: No hernia is present.  Musculoskeletal:     Cervical back: Neck supple.  Skin:    Findings: No rash.  Neurological:     Mental Status: She is alert.     (all labs ordered are listed, but only abnormal results are displayed) Labs Reviewed  CBC WITH DIFFERENTIAL/PLATELET - Abnormal; Notable for the following components:      Result Value   MCH 25.9 (*)    RDW 15.6 (*)    All other components within normal limits  URINALYSIS, ROUTINE W REFLEX MICROSCOPIC - Abnormal; Notable for the following components:   Leukocytes,Ua SMALL (*)    All other components within normal limits  WET PREP, GENITAL  COMPREHENSIVE METABOLIC PANEL WITH GFR  LIPASE, BLOOD    EKG: None  Radiology: CT ABDOMEN PELVIS WO CONTRAST Result Date: 09/12/2023 CLINICAL DATA:  RLQ abdominal pain. EXAM: CT ABDOMEN AND PELVIS WITHOUT CONTRAST TECHNIQUE: Multidetector CT imaging of the abdomen and pelvis was performed following the standard protocol without IV contrast. RADIATION DOSE REDUCTION: This exam was performed according to the departmental dose-optimization program which includes automated exposure control, adjustment of the mA and/or kV according to patient size and/or use of iterative reconstruction technique. COMPARISON:  CT scan abdomen and pelvis from 08/26/2020. FINDINGS: Lower chest: The lung bases are clear. No pleural effusion. The heart is normal in size. No pericardial effusion. Hepatobiliary: The liver is normal in size. Non-cirrhotic configuration. No suspicious mass. No intrahepatic or extrahepatic bile duct dilation. Small volume peripheral rim calcified gallstones noted without imaging signs of acute cholecystitis. Normal gallbladder wall thickness. No pericholecystic inflammatory changes. Pancreas: Unremarkable. No pancreatic ductal dilatation or surrounding inflammatory changes. Spleen: Within normal limits. No focal lesion. Adrenals/Urinary Tract: Adrenal  glands are unremarkable. No suspicious renal mass. No hydronephrosis. No renal or ureteric calculi. Urinary bladder is under distended, precluding optimal assessment. However, no large mass or stones identified. No perivesical fat stranding. Stomach/Bowel: No disproportionate dilation of the small or large bowel loops. No evidence of abnormal bowel wall thickening or inflammatory changes. The appendix is unremarkable. There are multiple diverticula mainly in the sigmoid colon, without imaging signs of diverticulitis. Vascular/Lymphatic: No ascites or pneumoperitoneum. No abdominal or pelvic lymphadenopathy, by size criteria. No aneurysmal dilation of the major abdominal arteries. There are mild peripheral atherosclerotic vascular calcifications of the aorta and its major branches. Reproductive: The uterus is surgically absent. No large adnexal mass. Other: There is a tiny fat containing umbilical hernia. The soft tissues and abdominal wall are otherwise unremarkable. Musculoskeletal: No suspicious osseous lesions. There are mild multilevel degenerative changes in the visualized spine. IMPRESSION: 1. No acute inflammatory process identified within the abdomen or pelvis. Unremarkable appendix. 2. Multiple other nonacute observations, as described above. Aortic Atherosclerosis (  ICD10-I70.0). Electronically Signed   By: Ree Molt M.D.   On: 09/12/2023 15:54     Procedures   Medications Ordered in the ED - No data to display                                  Medical Decision Making Amount and/or Complexity of Data Reviewed Labs: ordered. Radiology: ordered.   BP (!) 154/72   Pulse 97   Temp 98.2 F (36.8 C)   Resp 15   Ht 5' 2.5 (1.588 m)   Wt 85.1 kg   SpO2 99%   BMI 33.77 kg/m   70:44 PM  68 year old female with history of anemia, hyperlipidemia presenting with complaint of abdominal pain.  Patient report about a month ago she excellently stepped on a toothpick and injured her foot  subsequently developed foot infection.  As she was placed on antibiotic for 2 weeks.  While on antibiotics she developed a urinary tract infection for which she was placed on additional antibiotics and then subsequently developed a yeast infection and she was placed on antifungal medication.  She is here today stating that she still having recurrent pain to her lower abdomen.  Pain is sharp throbbing, more noticeable with movement and mild in severity.  Left foot infection has improved.  She is also requesting to be tested to make sure her UTI and yeast infection has resolved.  She does endorse some mild dysuria but that has since improved.  She does not endorse any fever or chills no nausea vomiting diarrhea no constipation no vaginal bleeding no rash.  No postprandial pain.  On exam this is a well-appearing elderly female resting comfortably appears to be in no acute discomfort.  Heart with normal rate and rhythm, lungs are clear abdomen is soft with mild right lower quadrant tenderness without guarding rebound tenderness.  No CVA tenderness.  -Labs ordered, independently viewed and interpreted by me.  Labs remarkable for normal WBC, normal H&H, urinalysis without signs of urinary tract infection, wet prep is currently pending -The patient was maintained on a cardiac monitor.  I personally viewed and interpreted the cardiac monitored which showed an underlying rhythm of: Sinus rhythm -Imaging independently viewed and interpreted by me and I agree with radiologist's interpretation.  Result remarkable for abdominal pelvis CT scan without any acute finding -This patient presents to the ED for concern of abdominal discomfort, this involves an extensive number of treatment options, and is a complaint that carries with it a high risk of complications and morbidity.  The differential diagnosis includes appendicitis, colitis, diverticulitis, UTI, kidney stone, pyelonephritis, shingles, muscle skeletal pain -Co  morbidities that complicate the patient evaluation includes anemia, hyperlipidemia -Treatment includes supportive care -Reevaluation of the patient after these medicines showed that the patient improved -PCP office notes or outside notes reviewed -Discussion with attending Dr. Albertina -Escalation to admission/observation considered: patients feels much better, is comfortable with discharge, and will follow up with PCP -Prescription medication considered, patient comfortable with Flagyl  -Social Determinant of Health considered   Wet prep did show evidence of clue cells.  This is a self swab.  Patient however denies having any vaginal discharge or odor.  I did offer Flagyl  as treatment for potential BV and after further discussion, patient agrees.  Will prescribe.  Outpatient follow-up recommended.      Final diagnoses:  BV (bacterial vaginosis)  RLQ abdominal pain  ED Discharge Orders          Ordered    metroNIDAZOLE  (FLAGYL ) 500 MG tablet  2 times daily        09/12/23 1656               Nivia Colon, PA-C 09/12/23 1656    Kommor, Lum, MD 09/13/23 [phone]

## 2023-09-18 ENCOUNTER — Telehealth: Payer: Self-pay | Admitting: Pharmacist

## 2023-09-18 MED ORDER — SEMAGLUTIDE (1 MG/DOSE) 4 MG/3ML ~~LOC~~ SOPN
1.0000 mg | PEN_INJECTOR | SUBCUTANEOUS | 11 refills | Status: DC
Start: 2023-09-18 — End: 2024-01-01

## 2023-09-18 NOTE — Telephone Encounter (Signed)
 Patient states that the 2mg  dose of Ozempic  is making her drowsy. It has happened with 3 doses. Would like to go back down to 1mg . Rx sent.

## 2023-09-29 NOTE — Progress Notes (Unsigned)
 Patient ID: [name]                 DOB: [birth year]                    MRN: [id number]      HPI: [name] is a 68 y.o. female patient referred to PharmD clinic by Dr. Santo. PMH is significant for diabetes mellitus type 2, hyperlipidemia, OSA, CKD 3.  Coronary calcium  score of 0 in 2022.  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  LDL-C 86 on atorvastatin  80 mg daily.  Patient did not tolerate ezetimibe . Repatha  was added in Feb 2025. LDL-C came down to 36.  I saw patient 07/18/23 for f/u on Ozempic  and Repatha . She mentioned her BP usually high when she see MD. She was not checking at home. On no BP medications. I asked her to check BP at home and f/u with me in 1 month.   I last saw patient 08/19/23. Her blood pressure was 152/86. Home BP cuff was accurate. Amlodipine  2.5mg  daily was started.  Patient presents today for follow-up.  She states that the amlodipine  makes her drowsy.  Has switched to taking it in the evening which has seemed to help.  She states she is only been on it for about 1 week.  She did not want to start it while she was taking antibiotics.  She reports that her blood pressure at home has been less than 130 on top and in the 70s diastolic.  She gave me 2 readings 129/70 and 124/68.  Home blood pressure cuff previously found to be accurate.  She complains of aches in her legs.  Will decrease her atorvastatin  to 40 mg.  Last LDL-C was in the 30s therefore no need to be on 80 mg of atorvastatin .  She has been doing training with her trainer but no walking due to the aches in her legs.  She wants to know why her blood pressure is all of a sudden elevated.  She brought in blood pressure readings from her primary care doctor since December 2024.  Blood pressure only at goal about 50% of the time.  Current BP Medications: amlodipine  2.5mg  daily Blood pressure goal: <130/80  Exercise: works out with trainer, weight lifting and cardio. Going to start walking  Diet: Doesn't cook with  salt,   Family History:  Family History  Problem Relation Age of Onset   Diabetes Mother    Hyperlipidemia Mother    Hypertension Father    Heart disease Father    BRCA 1/2 Neg Hx    Breast cancer Neg Hx     Social History: wine occasionally (1 on weekends), no tobacco  `  Labs: Lipid Panel     Component Value Date/Time   CHOL 104 07/11/2023 0932   TRIG 66 07/11/2023 0932   HDL 54 07/11/2023 0932   CHOLHDL 1.9 07/11/2023 0932   CHOLHDL 4.4 04/02/2008 0435   VLDL 19 04/02/2008 0435   LDLCALC 36 07/11/2023 0932   LABVLDL 14 07/11/2023 0932    Past Medical History:  Diagnosis Date   Anemia    Hyperlipidemia    Pre-diabetes    Seasonal allergies    Sleep apnea    Does not use CPAP every night   SVD (spontaneous vaginal delivery)    x 2    Current Outpatient Medications on File Prior to Visit  Medication Sig Dispense Refill   acetaminophen  (TYLENOL ) 500 MG tablet Take 2  tablets (1,000 mg total) by mouth every 8 (eight) hours. 30 tablet 0   amLODipine  (NORVASC ) 2.5 MG tablet Take 1 tablet (2.5 mg total) by mouth daily. 90 tablet 3   Ascorbic Acid (VITAMIN C) 1000 MG tablet Take 1,000 mg by mouth daily.     celecoxib  (CELEBREX ) 100 MG capsule Take 100 mg by mouth daily. Can take 2 tablets if needed     Cholecalciferol (VITAMIN D3) 25 MCG (1000 UT) CAPS Take by mouth. 1 tab daily     diphenhydrAMINE  (BENADRYL ) 25 MG tablet Take 1 tablet (25 mg total) by mouth every 6 (six) hours as needed. 30 tablet 0   EPINEPHrine  0.3 mg/0.3 mL IJ SOAJ injection Inject 0.3 mg into the muscle as needed for anaphylaxis. 1 each 0   Evolocumab  (REPATHA  SURECLICK) 140 MG/ML SOAJ Inject 140 mg into the skin every 14 (fourteen) days. 2 mL 11   fluticasone  (FLONASE ) 50 MCG/ACT nasal spray Place 1 spray into both nostrils daily as needed for allergies (allergies.).     Loratadine (CLARITIN PO) Take by mouth. daily     metroNIDAZOLE  (FLAGYL ) 500 MG tablet Take 1 tablet (500 mg total) by mouth 2  (two) times daily. 14 tablet 0   Semaglutide , 1 MG/DOSE, 4 MG/3ML SOPN Inject 1 mg into the skin once a week. 3 mL 11   No current facility-administered medications on file prior to visit.    Allergies  Allergen Reactions   Contrast Media [Iodinated Contrast Media] Anaphylaxis    Throat swelling, required epinephrine  and benadryl  a long with monitoring in the ED.   Ibuprofen  Rash   Naproxen Sodium Rash    Assessment/Plan:  1. Hyperlipidemia -  Elevated blood pressure reading Assessment: Blood pressure in clinic elevated above goal less than 130/80 The few home blood pressure readings patient's given have been at goal She is only been on amlodipine  about a week per patient Confused about why her blood pressure is elevated but also concerned about her cardiovascular risk-we discussed that we are targeting all of her risk factors including blood pressure and cholesterol.  Plan: Continue amlodipine  2.5 mg daily Check blood pressure at home daily and record readings.  Bring these readings to next visit Follow-up with Dr. Tellis early September and me at the end of September    Thank you,  Pranay Hilbun D [name], Pharm.[name], CPP [facility] [address] [place], [place] [address]  Phone: [phone]; Fax: [phone]

## 2023-09-30 ENCOUNTER — Ambulatory Visit: Attending: Cardiology | Admitting: Pharmacist

## 2023-09-30 VITALS — BP 160/72 | HR 87

## 2023-09-30 DIAGNOSIS — R030 Elevated blood-pressure reading, without diagnosis of hypertension: Secondary | ICD-10-CM | POA: Diagnosis present

## 2023-09-30 MED ORDER — ATORVASTATIN CALCIUM 40 MG PO TABS: 40.0000 mg | ORAL_TABLET | Freq: Every day | ORAL | 3 refills | Status: AC

## 2023-09-30 NOTE — Assessment & Plan Note (Signed)
 Assessment: Blood pressure in clinic elevated above goal less than 130/80 The few home blood pressure readings patient's given have been at goal She is only been on amlodipine  about a week per patient Confused about why her blood pressure is elevated but also concerned about her cardiovascular risk-we discussed that we are targeting all of her risk factors including blood pressure and cholesterol.  Plan: Continue amlodipine  2.5 mg daily Check blood pressure at home daily and record readings.  Bring these readings to next visit Follow-up with Dr. Tellis early September and me at the end of September

## 2023-09-30 NOTE — Patient Instructions (Addendum)
 Your blood pressure goal is < 130/10mmHg   Continue amlodipine  2.5mg  daily Please check blood pressure daily Please write down the readings and bring it with you to your next visit  Important lifestyle changes to control high blood pressure  Intervention  Effect on the BP   Weight loss Weight loss is one of the most effective lifestyle changes for controlling blood pressure. If you're overweight or obese, losing even a small amount of weight can help reduce blood pressure.    Blood pressure can decrease by 1 millimeter of mercury (mmHg) with each kilogram (about 2.2 pounds) of weight lost.   Exercise regularly As a general goal, aim for 30 minutes of moderate physical activity every day.    Regular physical activity can lower blood pressure by 5 - 8 mmHg.   Eat a healthy diet Eat a diet rich in whole grains, fruits, vegetables, lean meat, and low-fat dairy products. Limit processed foods, saturated fat, and sweets.    A heart-healthy diet can lower high blood pressure by 10 mmHg.   Reduce salt (sodium) in your diet Aim for 000mg  of sodium each day. Avoid deli meats, canned food, and frozen microwave meals which are high in sodium.     Limiting sodium can reduce blood pressure by 5 mmHg.   Limit alcohol One drink equals 12 ounces of beer, 5 ounces of wine, or 1.5 ounces of 80-proof liquor.    Limiting alcohol to < 1 drink a day for women or < 2 drinks a day for men can help lower blood pressure by about 4 mmHg.   To check your pressure at home you will need to:   Sit up in a chair, with feet flat on the floor and back supported. Do not cross your ankles or legs. Rest your left arm so that the cuff is about heart level. If the cuff goes on your upper arm, then just relax your arm on the table, arm of the chair, or your lap. If you have a wrist cuff, hold your wrist against your chest at heart level. Place the cuff snugly around your arm, about 1 inch above the crease of  your elbow. The cords should be inside the groove of your elbow.  Sit quietly, with the cuff in place, for about 5 minutes. Then press the power button to start a reading. Do not talk or move while the reading is taking place.  Record your readings on a sheet of paper. Although most cuffs have a memory, it is often easier to see a pattern developing when the numbers are all in front of you.  You can repeat the reading after 1-3 minutes if it is recommended.   Make sure your bladder is empty and you have not had caffeine or tobacco within the last 30 minutes   Always bring your blood pressure log with you to your appointments. If you have not brought your monitor in to be double checked for accuracy, please bring it to your next appointment.   You can find a list of validated (accurate) blood pressure cuffs at: validatebp.org

## 2023-10-04 ENCOUNTER — Telehealth: Payer: Self-pay | Admitting: Internal Medicine

## 2023-10-04 NOTE — Telephone Encounter (Signed)
 Attempted to reach patient but no answer. Left message to call back.  Josie RN

## 2023-10-04 NOTE — Telephone Encounter (Signed)
 Pt c/o medication issue:  1. Name of Medication:    2. How are you currently taking this medication (dosage and times per day)?    3. Are you having a reaction (difficulty breathing--STAT)? no  4. What is your medication issue? States that her meds is causing her so have real bad headaches, but not sure which one it is. Please advise

## 2023-10-07 NOTE — Telephone Encounter (Signed)
 Patient states she has had a headache before bed since starting amlodipine . She has been taking for 2 weeks now, with no improvement. She does report her BP readings are better  She is asking if she can cut amlodipine  in half to see if this helps. Already on lowest dose. Will forward to Dr. Santo and Pharm D to review and advise.

## 2023-10-08 MED ORDER — CHLORTHALIDONE 25 MG PO TABS: 25.0000 mg | ORAL_TABLET | Freq: Every day | ORAL | 3 refills | Status: DC

## 2023-10-08 NOTE — Telephone Encounter (Signed)
 Patient returning pharmacist's call.

## 2023-10-08 NOTE — Telephone Encounter (Signed)
 Called and LVM for pt to call back.

## 2023-10-08 NOTE — Telephone Encounter (Signed)
 Spoke with patient. Her blood pressures have been as follows: 132/76, 123/72, 133/72, 111/69, 132/70 (first thing in AM) She take amlodipine  at night. Takes her meds, then takes a shower and then gets in bed.  Goes to bed with headache Does not wake up with headache Headache starts before she puts on CPAP Will stop amlodipine  and start chlorthalidone  25mg  q AM. Follow up with me in 1 month. I did suggest she check her BP when she has a headache States she does not normally get headaches.

## 2023-10-16 ENCOUNTER — Ambulatory Visit: Admitting: Internal Medicine

## 2023-11-10 NOTE — Progress Notes (Unsigned)
 Patient ID: [name]                 DOB: [birth year]                      MRN: [id number]      HPI: [name] is a 68 y.o. female referred by Dr. Santo to HTN clinic. PMH is significant for diabetes mellitus type 2, hyperlipidemia, OSA, CKD 3. Coronary calcium  score of 0 in 2022. 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 LDL-C 86 on atorvastatin  80 mg daily. Patient did not tolerate ezetimibe . Repatha  was added in Feb 2025. LDL-C came down to 36.   Seen on 09/30/23 for hypertension management. Continued on amlodipine  2.5 mg daily, stated that she gets very tired after taking medication, so recommended to start taking it before bed.  Since last visit, she called to inform the office that amlodipine  was still giving her headaches after she took it. Switched to chlorthalidone  25 mg daily. Cancelled appointment with Dr. Santo in early September.   F/u home BP readings, side effects, headaches gone now that amlo stopped? How long has she been taking chlorthalidone , adherence  Current HTN meds: chlorthalidone  25 mg daily Previously tried: amlodipine  (drowsiness and headache) BP goal: < 130/80 mmHg  Family History:  Family History  Problem Relation Age of Onset   Diabetes Mother    Hyperlipidemia Mother    Hypertension Father    Heart disease Father    BRCA 1/2 Neg Hx    Breast cancer Neg Hx     Social History:   Diet:   Exercise:  {types:28256}  Home BP readings:  Date SBP/DBP  HR                              Average      Wt Readings from Last 3 Encounters:  09/12/23 187 lb 9.8 oz (85.1 kg)  07/24/23 187 lb 9.6 oz (85.1 kg)  01/23/23 185 lb 3.2 oz (84 kg)   BP Readings from Last 3 Encounters:  09/30/23 (!) 160/72  09/12/23 (!) 150/90  08/19/23 (!) 152/86   Pulse Readings from Last 3 Encounters:  09/30/23 87  09/12/23 86  08/19/23 80    Renal function: CrCl cannot be calculated (Patient's most recent lab result is older than the maximum 21 days  allowed.).  Past Medical History:  Diagnosis Date   Anemia    Hyperlipidemia    Pre-diabetes    Seasonal allergies    Sleep apnea    Does not use CPAP every night   SVD (spontaneous vaginal delivery)    x 2    Current Outpatient Medications on File Prior to Visit  Medication Sig Dispense Refill   acetaminophen  (TYLENOL ) 500 MG tablet Take 2 tablets (1,000 mg total) by mouth every 8 (eight) hours. 30 tablet 0   Ascorbic Acid (VITAMIN C) 1000 MG tablet Take 1,000 mg by mouth daily.     atorvastatin  (LIPITOR) 40 MG tablet Take 1 tablet (40 mg total) by mouth daily. 90 tablet 3   celecoxib  (CELEBREX ) 100 MG capsule Take 100 mg by mouth daily. Can take 2 tablets if needed     chlorthalidone  (HYGROTON ) 25 MG tablet Take 1 tablet (25 mg total) by mouth daily. 90 tablet 3   Cholecalciferol (VITAMIN D3) 25 MCG (1000 UT) CAPS Take by mouth. 1 tab daily     diphenhydrAMINE  (BENADRYL ) 25 MG  tablet Take 1 tablet (25 mg total) by mouth every 6 (six) hours as needed. 30 tablet 0   EPINEPHrine  0.3 mg/0.3 mL IJ SOAJ injection Inject 0.3 mg into the muscle as needed for anaphylaxis. 1 each 0   Evolocumab  (REPATHA  SURECLICK) 140 MG/ML SOAJ Inject 140 mg into the skin every 14 (fourteen) days. 2 mL 11   fluticasone  (FLONASE ) 50 MCG/ACT nasal spray Place 1 spray into both nostrils daily as needed for allergies (allergies.).     Loratadine (CLARITIN PO) Take by mouth. daily     metroNIDAZOLE  (FLAGYL ) 500 MG tablet Take 1 tablet (500 mg total) by mouth 2 (two) times daily. 14 tablet 0   Semaglutide , 1 MG/DOSE, 4 MG/3ML SOPN Inject 1 mg into the skin once a week. 3 mL 11   No current facility-administered medications on file prior to visit.    Allergies  Allergen Reactions   Contrast Media [Iodinated Contrast Media] Anaphylaxis    Throat swelling, required epinephrine  and benadryl  a long with monitoring in the ED.   Ibuprofen  Rash   Naproxen Sodium Rash    There were no vitals taken for this  visit.   Assessment/Plan: No BP recorded.  {Refresh Note OR Click here to enter BP  :1}***   1. Hypertension -  No problem-specific Assessment & Plan notes found for this encounter.      Thank you  Nidia Schaffer, PharmD PGY2 Cardiology Pharmacy Resident

## 2023-11-11 ENCOUNTER — Ambulatory Visit: Attending: Cardiology | Admitting: Pharmacist

## 2023-11-11 VITALS — BP 150/80 | HR 84

## 2023-11-11 DIAGNOSIS — R030 Elevated blood-pressure reading, without diagnosis of hypertension: Secondary | ICD-10-CM | POA: Diagnosis present

## 2023-11-11 NOTE — Assessment & Plan Note (Signed)
 Assessment: - BP in clinic today of 160/78 remains above goal of < 130/80 mmHg. Some home readings have been at goal, has a history of elevated office readings. - Reports adherence to amlodipine  since she started taking it with less medications at night - No longer reports side effects to therapy - Increasing her physical activity to help reduce blood pressure through lifestyle changes  Plan: - Continue amlodipine  2.5 mg daily - Record home BP readings over the next 2 weeks and call clinic to assess if amlodipine  dose needs to be increased - Continue to increase activity with exercises like walking in addition to tai chi and weight training - Follow up in clinic with Dr. Santo in November

## 2023-11-11 NOTE — Patient Instructions (Signed)
 Continue taking amlodipine  2.5 mg daily and record blood pressure readings at home for the next 2 weeks, give Melissa a call with these readings and we can discuss if the dose needs to be increased.

## 2023-11-26 ENCOUNTER — Encounter: Payer: Self-pay | Admitting: Pharmacist

## 2023-11-28 MED ORDER — AMLODIPINE BESYLATE 5 MG PO TABS: 5.0000 mg | ORAL_TABLET | Freq: Every day | ORAL | 3 refills | Status: DC

## 2023-12-04 MED ORDER — IRBESARTAN 75 MG PO TABS: 75.0000 mg | ORAL_TABLET | Freq: Every day | ORAL | 11 refills | Status: DC

## 2023-12-04 NOTE — Addendum Note (Signed)
 Addended by: Shai Mckenzie D on: 12/04/2023 03:02 PM   Modules accepted: Orders

## 2023-12-18 ENCOUNTER — Other Ambulatory Visit: Payer: Self-pay

## 2023-12-18 ENCOUNTER — Encounter: Payer: Self-pay | Admitting: Internal Medicine

## 2023-12-18 ENCOUNTER — Ambulatory Visit: Attending: Cardiology | Admitting: Internal Medicine

## 2023-12-18 VITALS — BP 138/78 | HR 80 | Resp 16 | Ht 62.0 in | Wt 192.4 lb

## 2023-12-18 DIAGNOSIS — E6609 Other obesity due to excess calories: Secondary | ICD-10-CM | POA: Insufficient documentation

## 2023-12-18 DIAGNOSIS — E782 Mixed hyperlipidemia: Secondary | ICD-10-CM

## 2023-12-18 DIAGNOSIS — R9431 Abnormal electrocardiogram [ECG] [EKG]: Secondary | ICD-10-CM | POA: Diagnosis not present

## 2023-12-18 DIAGNOSIS — Z6830 Body mass index (BMI) 30.0-30.9, adult: Secondary | ICD-10-CM | POA: Insufficient documentation

## 2023-12-18 DIAGNOSIS — E66811 Other obesity due to excess calories: Secondary | ICD-10-CM

## 2023-12-18 MED ORDER — CHLORTHALIDONE 25 MG PO TABS: 25.0000 mg | ORAL_TABLET | Freq: Every day | ORAL | 3 refills | Status: DC

## 2023-12-18 NOTE — Progress Notes (Signed)
 Cardiology Office Note:    Date:  12/18/2023   ID:  [name], DOB Aug 23, 1955, MRN [id number]  PCP:  Felisa Reece SQUIBB, MD  Cardiologist:  Stanly DELENA Leavens, MD   Referring MD: Felisa Reece SQUIBB, MD   CC: F/u prevention   History of Present Illness:    [name] is a 68 y.o. female with a hx of diabetes mellitus type 2, hyperlipidemia, OSA, CKD 3, referred for evaluation of shortness of breath in 2022 [name]). Her Mother was his patient.   [name] is a 69 year old female with type 2 diabetes, CKD stage 3, hyperlipidemia, and hypertension who presents for secondary prevention.  She has type 2 diabetes and chronic kidney disease stage 3. Her current medication regimen includes semaglutide  at a 2 mg dose, which causes significant fatigue. Despite a 30-pound weight loss, she feels her weight loss has plateaued.  She has hyperlipidemia with an elevated lipoprotein(a) level. She is on Repatha  and atorvastatin  40 mg, which have successfully reduced her LDL levels to below 36. She previously experienced muscle aches with higher doses of atorvastatin  and with Zetia , which was discontinued due to muscle pain.  She has hypertension with fluctuating blood pressure readings, ranging from 130s/80s to 150s/160s. She has experienced side effects from multiple antihypertensive medications, including dizziness and symptoms resembling a urinary tract infection with amlodipine , which were not confirmed. She has previously taken irbesartan and has recently been prescribed chlorthalidone  25 mg daily for blood pressure management.  She is sensitive to medications, experiencing side effects such as dizziness and muscle aches. She takes Celebrex  and has noted interactions with amlodipine . She also takes Claritin daily for allergies.  She maintains an active lifestyle, working with a trainer twice a week and incorporating walking into her routine. Her husband has also made dietary changes  to support her health goals. She reports no discomfort during exercise or while working with her trainer.  Discussed the use of AI scribe software for clinical note transcription with the patient, who gave verbal consent to proceed.   Past Medical History:  Diagnosis Date   Anemia    Hyperlipidemia    Pre-diabetes    Seasonal allergies    Sleep apnea    Does not use CPAP every night   SVD (spontaneous vaginal delivery)    x 2    Past Surgical History:  Procedure Laterality Date   COLONOSCOPY  2008   DILATION AND CURETTAGE OF UTERUS  04/2011   POLYPECTOMY  05/28/2011   Procedure: POLYPECTOMY;  Surgeon: Dickey FORBES Pines, MD;  Location: [facility];  Service: Gynecology;  Laterality: N/A;   ROBOTIC ASSISTED TOTAL HYSTERECTOMY Bilateral 03/11/2013   Procedure: ROBOTIC ASSISTED TOTAL HYSTERECTOMY WITH BILATERAL SALPINGO-OOPHORECTOMY;  Surgeon: Dickie DELENA Carder, MD;  Location: [facility];  Service: Gynecology;  Laterality: Bilateral;   SHOULDER ARTHROSCOPY Right 2015   svd     x 2   TONSILLECTOMY     TOTAL KNEE REVISION Right 06/11/2019   Procedure: TOTAL KNEE REVISION;  Surgeon: Ernie Cough, MD;  Location: [facility];  Service: Orthopedics;  Laterality: Right;  120 mins   TUBAL LIGATION      Current Medications: Current Meds  Medication Sig   acetaminophen  (TYLENOL ) 500 MG tablet Take 2 tablets (1,000 mg total) by mouth every 8 (eight) hours.   Ascorbic Acid (VITAMIN C) 1000 MG tablet Take 1,000 mg by mouth daily.   atorvastatin  (LIPITOR) 40 MG tablet Take 1 tablet (40 mg  total) by mouth daily.   celecoxib  (CELEBREX ) 100 MG capsule Take 100 mg by mouth daily. Can take 2 tablets if needed (Patient taking differently: Take 100 mg by mouth 2 (two) times daily.)   chlorthalidone  (HYGROTON ) 25 MG tablet Take 1 tablet (25 mg total) by mouth daily.   Cholecalciferol (VITAMIN D3) 25 MCG (1000 UT) CAPS Take by mouth. 1 tab daily   diphenhydrAMINE  (BENADRYL ) 25 MG tablet Take 1 tablet (25 mg total)  by mouth every 6 (six) hours as needed.   EPINEPHrine  0.3 mg/0.3 mL IJ SOAJ injection Inject 0.3 mg into the muscle as needed for anaphylaxis.   Evolocumab  (REPATHA  SURECLICK) 140 MG/ML SOAJ Inject 140 mg into the skin every 14 (fourteen) days.   Loratadine (CLARITIN PO) Take by mouth. daily   Semaglutide , 1 MG/DOSE, 4 MG/3ML SOPN Inject 1 mg into the skin once a week.   [DISCONTINUED] amLODipine  (NORVASC ) 2.5 MG tablet Take 2.5 mg by mouth daily.     Allergies:   Contrast media [iodinated contrast media], Ibuprofen , and Naproxen sodium   Social History   Socioeconomic History   Marital status: Married    Spouse name: Not on file   Number of children: Not on file   Years of education: Not on file   Highest education level: Not on file  Occupational History   Not on file  Tobacco Use   Smoking status: Never   Smokeless tobacco: Never  Vaping Use   Vaping status: Never Used  Substance and Sexual Activity   Alcohol use: Yes    Comment: wine - weekends   Drug use: No   Sexual activity: Yes    Birth control/protection: Surgical  Other Topics Concern   Not on file  Social History Narrative   Not on file   Social Drivers of Health   Financial Resource Strain: Not on file  Food Insecurity: Low Risk  (01/08/2023)   Received from [facility]   Hunger Vital Sign    Within the past 12 months, you worried that your food would run out before you got money to buy more: Never true    Within the past 12 months, the food you bought just didn't last and you didn't have money to get more. : Never true  Transportation Needs: No Transportation Needs (01/08/2023)   Received from [facility]    In the past 12 months, has lack of reliable transportation kept you from medical appointments, meetings, work or from getting things needed for daily living? : No  Physical Activity: Not on file  Stress: Not on file  Social Connections: Not on file     Family History: The  patient's family history includes Diabetes in her mother; Heart disease in her father; Hyperlipidemia in her mother; Hypertension in her father. There is no history of BRCA 1/2 or Breast cancer.  ROS:   Please see the history of present illness.     EKGs/Labs/Other Studies Reviewed:    Cardiac Studies & Procedures   ______________________________________________________________________________________________     ECHOCARDIOGRAM  ECHOCARDIOGRAM COMPLETE 11/07/2020  Narrative ECHOCARDIOGRAM REPORT    Patient Name:   [name] Date of Exam: 11/07/2020 Medical Rec #:  [id number]      Height:       63.0 in Accession #:    [id number]     Weight:       212.8 lb Date of Birth:  16-Nov-1955      BSA:  1.985 m Patient Age:    68 years       BP:           161/72 mmHg Patient Gender: F              HR:           76 bpm. Exam Location:  [address]  Procedure: 2D Echo  Indications:    R06.00 SOB  History:        Patient has no prior history of Echocardiogram examinations. Abnormal ECG; Risk Factors:Diabetes, Sleep Apnea and HLD, CKD.  Sonographer:    Waldo Guadalajara RCS Referring Phys: [phone] [facility]  IMPRESSIONS   1. Left ventricular ejection fraction, by estimation, is 60 to 65%. The left ventricle has normal function. The left ventricle has no regional wall motion abnormalities. Left ventricular diastolic parameters were normal. 2. Right ventricular systolic function is normal. The right ventricular size is normal. There is normal pulmonary artery systolic pressure. The estimated right ventricular systolic pressure is 25.8 mmHg. 3. The mitral valve is normal in structure. No evidence of mitral valve regurgitation. No evidence of mitral stenosis. 4. The aortic valve was not well visualized. Aortic valve regurgitation is not visualized. Mild aortic valve sclerosis is present, with no evidence of aortic valve stenosis. 5. The inferior vena cava is normal in size with  greater than 50% respiratory variability, suggesting right atrial pressure of 3 mmHg.  FINDINGS Left Ventricle: Left ventricular ejection fraction, by estimation, is 60 to 65%. The left ventricle has normal function. The left ventricle has no regional wall motion abnormalities. The left ventricular internal cavity size was normal in size. There is no left ventricular hypertrophy. Left ventricular diastolic parameters were normal. Normal left ventricular filling pressure.  Right Ventricle: The right ventricular size is normal. No increase in right ventricular wall thickness. Right ventricular systolic function is normal. There is normal pulmonary artery systolic pressure. The tricuspid regurgitant velocity is 2.39 m/s, and with an assumed right atrial pressure of 3 mmHg, the estimated right ventricular systolic pressure is 25.8 mmHg.  Left [facility]: Left atrial size was normal in size.  Right [facility]: Right atrial size was normal in size.  Pericardium: There is no evidence of pericardial effusion.  Mitral Valve: The mitral valve is normal in structure. No evidence of mitral valve regurgitation. No evidence of mitral valve stenosis.  Tricuspid Valve: The tricuspid valve is normal in structure. Tricuspid valve regurgitation is trivial. No evidence of tricuspid stenosis.  Aortic Valve: The aortic valve was not well visualized. Aortic valve regurgitation is not visualized. Mild aortic valve sclerosis is present, with no evidence of aortic valve stenosis.  Pulmonic Valve: The pulmonic valve was normal in structure. Pulmonic valve regurgitation is not visualized. No evidence of pulmonic stenosis.  Aorta: The aortic root is normal in size and structure.  Venous: The inferior vena cava is normal in size with greater than 50% respiratory variability, suggesting right atrial pressure of 3 mmHg.  IAS/Shunts: No atrial level shunt detected by color flow Doppler.   LEFT VENTRICLE PLAX 2D LVIDd:          4.20 cm  Diastology LVIDs:         2.80 cm  LV e' medial:    7.94 cm/s LV PW:         1.00 cm  LV E/e' medial:  11.5 LV IVS:        0.80 cm  LV e' lateral:   9.68  cm/s LVOT diam:     1.70 cm  LV E/e' lateral: 9.4 LV SV:         48 LV SV Index:   24 LVOT Area:     2.27 cm   RIGHT VENTRICLE RV Basal diam:  2.70 cm RV S prime:     10.10 cm/s TAPSE (M-mode): 2.2 cm RVSP:           25.8 mmHg  LEFT ATRIUM             Index       RIGHT ATRIUM           Index LA diam:        3.20 cm 1.61 cm/m  RA Pressure: 3.00 mmHg LA Vol (A2C):   33.1 ml 16.67 ml/m RA Area:     9.88 cm LA Vol (A4C):   33.9 ml 17.08 ml/m RA Volume:   18.10 ml  9.12 ml/m LA Biplane Vol: 34.4 ml 17.33 ml/m AORTIC VALVE LVOT Vmax:   105.00 cm/s LVOT Vmean:  69.200 cm/s LVOT VTI:    0.211 m  AORTA Ao Root diam: 2.90 cm Ao Asc diam:  3.00 cm  MITRAL VALVE                 TRICUSPID VALVE MV Area (PHT):               TR Peak grad:   22.8 mmHg MV Decel Time:               TR Vmax:        239.00 cm/s MR Peak grad:    100.4 mmHg  Estimated RAP:  3.00 mmHg MR Mean grad:    75.0 mmHg   RVSP:           25.8 mmHg MR Vmax:         501.00 cm/s MR Vmean:        423.0 cm/s  SHUNTS MR PISA:         1.37 cm    Systemic VTI:  0.21 m MR PISA Eff ROA: 8 mm       Systemic Diam: 1.70 cm MR PISA Radius:  0.47 cm MV E velocity: 91.20 cm/s MV A velocity: 106.00 cm/s MV E/A ratio:  0.86  Wilbert Bihari MD Electronically signed by Wilbert Bihari MD Signature Date/Time: 11/07/2020/2:21:03 PM    Final      CT SCANS  CT CARDIAC SCORING (SELF PAY ONLY) 11/07/2020  Addendum 11/07/2020  3:14 PM ADDENDUM REPORT: 11/07/2020 15:11  CLINICAL DATA:  Risk stratification: 67 Year-old African American Female  EXAM: Coronary Calcium  Score  TECHNIQUE: The patient was scanned on a [employer] scanner. Axial non-contrast 3 mm slices were carried out through the heart. The data set was analyzed on a dedicated work station and  scored using the Agatson method.  FINDINGS: Non-cardiac: See separate report from [facility].  Ascending Aorta: Normal caliber.  Pericardium: Normal.  Coronary arteries: Normal origins.  Coronary Calcium  Score:  Left main: 0  Left anterior descending artery: 0  Left circumflex artery: 0  Right coronary artery: 0  Total: 0  Percentile: 1st for age, sex, and race matched control.  IMPRESSION: 1. Coronary calcium  score of 0. This was 1st percentile for age, gender, and race matched controls.  RECOMMENDATIONS:  Coronary artery calcium  (CAC) score is a strong predictor of incident coronary heart disease (CHD) and provides predictive information beyond traditional risk factors. CAC scoring is  reasonable to use in the decision to withhold, postpone, or initiate statin therapy in intermediate-risk or selected borderline-risk asymptomatic adults (age 20-75 years and LDL-C >=70 to <190 mg/dL) who do not have diabetes or established atherosclerotic cardiovascular disease (ASCVD).* In intermediate-risk (10-year ASCVD risk >=7.5% to <20%) adults or selected borderline-risk (10-year ASCVD risk >=5% to <7.5%) adults in whom a CAC score is measured for the purpose of making a treatment decision the following recommendations have been made:  If CAC = 0, it is reasonable to withhold statin therapy and reassess in 5 to 10 years, as long as higher risk conditions are absent (diabetes mellitus, family history of premature CHD in first degree relatives (males <55 years; females <65 years), cigarette smoking, LDL >=190 mg/dL or other independent risk factors).  If CAC is 1 to 99, it is reasonable to initiate statin therapy for patients >=23 years of age.  If CAC is >=100 or >=75th percentile, it is reasonable to initiate statin therapy at any age.  Cardiology referral should be considered for patients with CAC scores =400 or >=75th percentile.  *2018  AHA/ACC/AACVPR/AAPA/ABC/ACPM/ADA/AGS/APhA/ASPC/NLA/PCNA Guideline on the Management of Blood Cholesterol: A Report of the American College of Cardiology/American Heart Association Task Force on Clinical Practice Guidelines. J Am Coll Cardiol. 2019;73(24):3168-3209.  Stanly Leavens, MD   Electronically Signed By: Stanly Leavens M.D. On: 11/07/2020 15:11  Narrative EXAM: OVER-READ INTERPRETATION  CT CHEST  The following report is an over-read performed by radiologist Dr. Toribio Aye of [facility] on 11/07/2020. This over-read does not include interpretation of cardiac or coronary anatomy or pathology. The coronary calcium  score/coronary CTA interpretation by the cardiologist is attached.  COMPARISON:  None.  FINDINGS: Within the visualized portions of the thorax there are no suspicious appearing pulmonary nodules or masses, there is no acute consolidative airspace disease, no pleural effusions, no pneumothorax and no lymphadenopathy. Visualized portions of the upper abdomen are unremarkable. There are no aggressive appearing lytic or blastic lesions noted in the visualized portions of the skeleton.  IMPRESSION: No significant incidental noncardiac findings are noted.  Electronically Signed: By: Toribio Aye M.D. On: 11/07/2020 14:03     ______________________________________________________________________________________________      Physical Exam:    VS:  BP 138/78 (BP Location: Left Arm, Patient Position: Sitting, Cuff Size: Large)   Pulse 80   Resp 16   Ht 5' 2 (1.575 m)   Wt 192 lb 6.4 oz (87.3 kg)   SpO2 99%   BMI 35.19 kg/m     Wt Readings from Last 3 Encounters:  12/18/23 192 lb 6.4 oz (87.3 kg)  09/12/23 187 lb 9.8 oz (85.1 kg)  07/24/23 187 lb 9.6 oz (85.1 kg)    GEN: O No acute distress HEENT: Normal VASCULAR:  Normal Pulses.  RESPIRATORY:  Clear to auscultation without rales, wheezing or rhonchi  ABDOMEN:  Soft, non-tender, non-distended MUSCULOSKELETAL: No deformity  SKIN: Warm and dry NEUROLOGIC:  Alert and oriented x 3 PSYCHIATRIC:  Normal affect   ASSESSMENT/PLAN:    Hypertension with medication intolerance - Hypertension with fluctuating blood pressure readings between 130s/80s to 150s/160s. Intolerance to multiple antihypertensive medications including amlodipine  and irbesartan. Current blood pressure is 138/78. Previous trials with Zetia  and atorvastatin  resulted in muscle aches. Considering medication sensitivity and kidney function, a change in medication is necessary. Carvedilol was considered but not initiated due to potential side effects and current medication availability. Chlorthalidone  is chosen as the next step due to its diuretic properties and  previous tolerance. - Started chlorthalidone  25 mg PO daily. - Discontinued amlodipine . - Ordered basic metabolic panel in two weeks to monitor electrolytes. - Will consider carvedilol as a backup if chlorthalidone  is not tolerated.  Type 2 diabetes mellitus  Morbid obesity and OSA Type 2 diabetes with CKD stage 3 per history but with normal creatinine. Current management includes semaglutide . Blood sugar control is a priority, and carvedilol may aid in improving overall sensitivity, though not as a primary treatment for diabetes.  - Continue semaglutide  at current dose.  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 Current management includes Repatha  and atorvastatin . LDL levels are well-controlled with LDL under 36. No current trials available for lipoprotein(a) reduction. PCSK9 inhibitors may increase lipoprotein(a) by up to 25%, but no FDA-approved targets exist for lipoprotein(a). - Continue Repatha  and atorvastatin .  Will see me in one year Will see my team at end of year for BP titration  Longitudinal  care: The evaluation and management services provided today reflect the complexity inherent in caring for this patient, including the ongoing longitudinal relationship and management of multiple chronic conditions and/or the need for care coordination. The visit required a comprehensive assessment and management plan tailored to the patient's unique needs Time was spent addressing not only the acute concerns but also the broader context of the patient's health, including preventive care, chronic disease management, and care coordination as appropriate.  Complex longitudinal is necessary for conditions including: Lpa very elevated with strong sensitivity to medication therapy requiring BP goal of under 140/80     Stanly Leavens, MD FASE [facility] Cardiologist [facility]  [address] [place], [place] [address] [phone]  10:44 AM

## 2023-12-18 NOTE — Patient Instructions (Addendum)
 Medication Instructions:  Your physician has recommended you make the following change in your medication:  1) STOP taking amlodipine   2) START taking chlorthalidone  25 mg once daily   *If you need a refill on your cardiac medications before your next appointment, please call your pharmacy*  Lab Work: BMET in two weeks - please go to any [facility] location to have these drawn  Follow-Up: At [facility], you and your health needs are our priority.  As part of our continuing mission to provide you with exceptional heart care, our providers are all part of one team.  This team includes your primary Cardiologist (physician) and Advanced Practice Providers or APPs (Physician Assistants and Nurse Practitioners) who all work together to provide you with the care you need, when you need it.  Your next appointment:   6 week(s)  Provider:   PharmD - Hypertension clinic

## 2023-12-19 ENCOUNTER — Telehealth: Payer: Self-pay | Admitting: Pharmacy Technician

## 2023-12-19 ENCOUNTER — Other Ambulatory Visit (HOSPITAL_COMMUNITY): Payer: Self-pay

## 2023-12-19 NOTE — Telephone Encounter (Addendum)
   Pharmacy Patient Advocate Encounter   Received notification from Pt Calls Messages that prior authorization for mounjaro is required/requested.   Insurance verification completed.   The patient is insured through [facility].   Per test claim: PA required; PA submitted to above mentioned insurance via Latent Key/confirmation #/EOC [id number] Status is pending

## 2023-12-19 NOTE — Telephone Encounter (Signed)
 Pharmacy Patient Advocate Encounter  Received notification from [facility] that Prior Authorization for mounjaro has been APPROVED from 12/19/23 to 12/19/26. Ran test claim, Copay is $47.00. This test claim was processed through [facility]- copay amounts may vary at other pharmacies due to pharmacy/plan contracts, or as the patient moves through the different stages of their insurance plan.   PA #/Case ID/Reference #: [id number]

## 2023-12-20 ENCOUNTER — Emergency Department (HOSPITAL_COMMUNITY)
Admission: EM | Admit: 2023-12-20 | Discharge: 2023-12-20 | Disposition: A | Discharge status: Home / Self Care | Attending: Emergency Medicine | Admitting: Emergency Medicine

## 2023-12-20 ENCOUNTER — Encounter: Payer: Self-pay | Admitting: Pharmacist

## 2023-12-20 ENCOUNTER — Emergency Department (HOSPITAL_COMMUNITY)

## 2023-12-20 DIAGNOSIS — R42 Dizziness and giddiness: Secondary | ICD-10-CM | POA: Diagnosis not present

## 2023-12-20 DIAGNOSIS — R0789 Other chest pain: Secondary | ICD-10-CM | POA: Insufficient documentation

## 2023-12-20 DIAGNOSIS — I10 Essential (primary) hypertension: Secondary | ICD-10-CM | POA: Diagnosis not present

## 2023-12-20 DIAGNOSIS — Z79899 Other long term (current) drug therapy: Secondary | ICD-10-CM | POA: Diagnosis not present

## 2023-12-20 DIAGNOSIS — R079 Chest pain, unspecified: Secondary | ICD-10-CM | POA: Diagnosis present

## 2023-12-20 LAB — CBC
HCT: 41 % (ref 36.0–46.0)
Hemoglobin: 12.6 g/dL (ref 12.0–15.0)
MCH: 25 pg — ABNORMAL LOW (ref 26.0–34.0)
MCHC: 30.7 g/dL (ref 30.0–36.0)
MCV: 81.2 fL (ref 80.0–100.0)
Platelets: 279 K/uL (ref 150–400)
RBC: 5.05 MIL/uL (ref 3.87–5.11)
RDW: 14.8 % (ref 11.5–15.5)
WBC: 5.9 K/uL (ref 4.0–10.5)
nRBC: 0 % (ref 0.0–0.2)

## 2023-12-20 LAB — BASIC METABOLIC PANEL WITH GFR
Anion gap: 9 (ref 5–15)
BUN: 17 mg/dL (ref 8–23)
CO2: 30 mmol/L (ref 22–32)
Calcium: 9.7 mg/dL (ref 8.9–10.3)
Chloride: 100 mmol/L (ref 98–111)
Creatinine, Ser: 0.81 mg/dL (ref 0.44–1.00)
GFR, Estimated: >60 mL/min (ref >60)
Glucose, Bld: 83 mg/dL (ref 70–99)
Potassium: 3.9 mmol/L (ref 3.5–5.1)
Sodium: 139 mmol/L (ref 135–145)

## 2023-12-20 LAB — TROPONIN T, HIGH SENSITIVITY: Troponin T High Sensitivity: <15 ng/L (ref 0–19)

## 2023-12-20 MED ORDER — CARVEDILOL 3.125 MG PO TABS: 3.1250 mg | ORAL_TABLET | Freq: Two times a day (BID) | ORAL | 0 refills | Status: DC

## 2023-12-20 NOTE — Discharge Instructions (Signed)
 You were seen in the emergency department for your chest pain and your dizziness.  Your workup showed no signs of heart attack or abnormalities within your lungs and no signs of dehydration or kidney dysfunction.  Is unclear what is causing your chest pain at this time but your dizziness may be a side effect of the new blood pressure medication that you started.  You should stop the chlorthalidone  and I have given you a prescription of carvedilol to take instead.  This is the lowest dose of this blood pressure medicine you should follow-up with your primary doctor or your cardiologist to have your symptoms rechecked and they may be able to titrate this as you are able to tolerate for blood pressure control.  You should return to the emergency department if you have significantly worsening pain, severe shortness of breath, you pass out or if you have any other new or concerning symptoms.

## 2023-12-20 NOTE — ED Triage Notes (Signed)
 Started clonidine  on Wednesday which has been making her feel dizzy. Started noticing some intermittent chest pain at the same time which has intensified this morning. Some headache. Denies SOB.

## 2023-12-20 NOTE — ED Provider Notes (Signed)
 [place] EMERGENCY DEPARTMENT AT [facility] Provider Note   CSN: [id number] Arrival date & time: 12/20/23  1339     Patient presents with: Chest Pain   [name] is a 68 y.o. female.   Patient is a 68 year old female with a past medical history of hypertension and sensitivity to multiple antihypertensive medications presenting to the emergency department with dizziness and chest pain.  She states that her cardiologist changed her to a new blood pressure medication 2 days ago and since then her dizziness has gotten worse.  She states that she woke up this morning she developed a right sided chest pain.  She states it has been constant since she woke up.  She states that it feels like a cramping type of pain.  She states she has had no significant shortness of breath, no cough or fever.  Denies any lower extremity swelling.  She denies any history of VTE, any recent hospitalization or surgery, no recent long travel car or plane, any hormone use or cancer history.  She denies any recent heavy lifting or repetitive activity.  The history is provided by the patient.  Chest Pain      Prior to Admission medications   Medication Sig Start Date End Date Taking? Authorizing Provider  carvedilol (COREG) 3.125 MG tablet Take 1 tablet (3.125 mg total) by mouth 2 (two) times daily with a meal. 12/20/23 01/19/24 Yes Kingsley, Keron Neenan K, DO  acetaminophen  (TYLENOL ) 500 MG tablet Take 2 tablets (1,000 mg total) by mouth every 8 (eight) hours. 06/12/19   Patti Rosina SAUNDERS, PA-C  Ascorbic Acid (VITAMIN C) 1000 MG tablet Take 1,000 mg by mouth daily.    [provider]  atorvastatin  (LIPITOR) 40 MG tablet Take 1 tablet (40 mg total) by mouth daily. 09/30/23 12/29/23  Santo Stanly LABOR, MD  celecoxib  (CELEBREX ) 100 MG capsule Take 100 mg by mouth daily. Can take 2 tablets if needed Patient taking differently: Take 100 mg by mouth 2 (two) times daily.    [provider]   Cholecalciferol (VITAMIN D3) 25 MCG (1000 UT) CAPS Take by mouth. 1 tab daily    [provider]  diphenhydrAMINE  (BENADRYL ) 25 MG tablet Take 1 tablet (25 mg total) by mouth every 6 (six) hours as needed. 08/26/20   Aberman, Caroline C, PA-C  EPINEPHrine  0.3 mg/0.3 mL IJ SOAJ injection Inject 0.3 mg into the muscle as needed for anaphylaxis. 08/26/20   Aberman, Caroline C, PA-C  Evolocumab  (REPATHA  SURECLICK) 140 MG/ML SOAJ Inject 140 mg into the skin every 14 (fourteen) days. 03/20/23   Santo Stanly A, MD  Loratadine (CLARITIN PO) Take by mouth. daily    [provider]  Semaglutide , 1 MG/DOSE, 4 MG/3ML SOPN Inject 1 mg into the skin once a week. 09/18/23   Santo Stanly LABOR, MD    Allergies: Contrast media [iodinated contrast media], Ibuprofen , and Naproxen sodium    Review of Systems  Cardiovascular:  Positive for chest pain.    Updated Vital Signs BP 139/79 (BP Location: Left Arm)   Pulse 81   Temp 98 F (36.7 C) (Oral)   Resp 16   SpO2 100%   Physical Exam Vitals and nursing note reviewed.  Constitutional:      General: She is not in acute distress.    Appearance: She is well-developed.  HENT:     Head: Normocephalic and atraumatic.  Eyes:     Extraocular Movements: Extraocular movements intact.  Cardiovascular:  Rate and Rhythm: Normal rate and regular rhythm.     Heart sounds: Normal heart sounds.  Pulmonary:     Effort: Pulmonary effort is normal.     Breath sounds: Normal breath sounds.  Chest:     Chest wall: Tenderness (Mild right sided chest wall) present.  Abdominal:     Palpations: Abdomen is soft.     Tenderness: There is no abdominal tenderness.  Musculoskeletal:        General: Normal range of motion.     Cervical back: Normal range of motion and neck supple.     Right lower leg: No edema.     Left lower leg: No edema.  Skin:    General: Skin is warm and dry.  Neurological:     General: No focal deficit present.      Mental Status: She is alert and oriented to person, place, and time.  Psychiatric:        Mood and Affect: Mood normal.        Behavior: Behavior normal.     (all labs ordered are listed, but only abnormal results are displayed) Labs Reviewed  CBC - Abnormal; Notable for the following components:      Result Value   MCH 25.0 (*)    All other components within normal limits  BASIC METABOLIC PANEL WITH GFR  TROPONIN T, HIGH SENSITIVITY    EKG: EKG Interpretation Date/Time:  Friday December 20 2023 13:50:33 EST Ventricular Rate:  79 PR Interval:  154 QRS Duration:  92 QT Interval:  375 QTC Calculation: 430 R Axis:   28  Text Interpretation: Sinus rhythm Abnormal R-wave progression, early transition Since last tracing of earlier today No significant change was found Confirmed by Ellouise Fine ([id number]) on 12/20/2023 2:51:42 PM  Radiology: DG Chest 2 View Result Date: 12/20/2023 EXAM: 2 VIEW(S) XRAY OF THE CHEST 12/20/2023 02:44:00 PM COMPARISON: CLINICAL HISTORY: cp FINDINGS: LUNGS AND PLEURA: No focal pulmonary opacity. No pulmonary edema. No pleural effusion. No pneumothorax. HEART AND MEDIASTINUM: No acute abnormality of the cardiac and mediastinal silhouettes. BONES AND SOFT TISSUES: No acute osseous abnormality. IMPRESSION: 1. No acute process. Electronically signed by: Lynwood Seip MD 12/20/2023 03:22 PM EST RP Workstation: [id number]     Procedures   Medications Ordered in the ED - No data to display  Clinical Course as of 12/20/23 1623  Fri Dec 20, 2023  1616 Orthostatics negative. Will recommend BP med change and close cardiology or PCP follow up. [VK]    Clinical Course User Index [VK] Kingsley, Tamon Parkerson K, DO                                 Medical Decision Making This patient presents to the ED with chief complaint(s) of chest pain, dizziness with pertinent past medical history of hypertension which further complicates the presenting complaint. The complaint  involves an extensive differential diagnosis and also carries with it a high risk of complications and morbidity.    The differential diagnosis includes ACS, arrhythmia, anemia, pneumonia, pneumothorax, pulmonary edema, pleural effusion, orthostatic hypotension, dehydration, electrolyte abnormality, medication side effect, MSK pain  Additional history obtained: Additional history obtained from N/A Records reviewed outpatient cardiology records  ED Course and Reassessment: On patient's arrival she is hemodynamically stable in no acute distress.  Had EKG, labs and chest x-ray ordered from triage.  Patient's labs are within normal range no acute ischemic changes on  EKG, chest x-ray pending.  Show orthostatics performed.  Per her cardiology note if she was having side effects on the chlorthalidone  would recommend changing her to carvedilol.  Will consider medication change show remainder of her workup appear normal.  Independent labs interpretation:  The following labs were independently interpreted: within normal range  Independent visualization of imaging: - I independently visualized the following imaging with scope of interpretation limited to determining acute life threatening conditions related to emergency care: CXR, which revealed no acute disease  Consultation: - Consulted or discussed management/test interpretation w/ external professional: N/A  Consideration for admission or further workup: Patient has no emergent conditions requiring admission or further work-up at this time and is stable for discharge home with primary care or cardiology follow-up  Social Determinants of health: N/A    Amount and/or Complexity of Data Reviewed Labs: ordered. Radiology: ordered.  Risk Prescription drug management.       Final diagnoses:  Nonspecific chest pain  Dizziness    ED Discharge Orders          Ordered    carvedilol (COREG) 3.125 MG tablet  2 times daily with meals         12/20/23 1621               Kingsley, Euretha Najarro K, DO 12/20/23 1623

## 2023-12-20 NOTE — ED Notes (Signed)
 Urine collected and sent to lab.

## 2023-12-31 ENCOUNTER — Ambulatory Visit: Admitting: Pharmacist

## 2023-12-31 NOTE — Progress Notes (Unsigned)
 Patient ID: [name]                 DOB: [birth year]                      MRN: [id number]      HPI: [name] is a 68 y.o. female referred by Dr. Santo  to HTN clinic. PMH is significant for T2DM, HLD, OSA, and CKD3.  Patient was last seen in office by Dr. Santo on 12/18/2023. She has fluctuating BP readings from SBP 130s-150s/160s.   Patient was last seen in office by Melissa on 11/11/23. BP at this visit was 150/80 with HR 84 on amlodipine  2.5 mg once daily. During this visit, the patient stated she takes all of her oral medications at night, which may be contributing to her headaches. She started taking the atorvastatin  and one of the celecoxib  capsules in the morning and the amlodipine  and other celecoxib  capsule at night. She also stated that she had increased her exercise by participating in tai chi classes and working with a trainer twice weekly. Patient was educated on how to correctly take her BP and instructed to monitor her BP over the next 2 weeks to assess need to adjust medications.  Melissa checked in with patient on 10/14. Home BP readings provided ranged from 118-168/70s-80s. Amlodipine  was continued at 2.5 mg due to patient report of feeling dizzy/lightheaded on amlodipine  5 mg and irbesartan 75 mg once daily was added.  Patient reports to clinic today for BP follow-up and to discuss weight loss on Ozempic  2mg  once weekly. Last weight was 192 lb on 11/5.  Current HTN meds: irbesartan 75 mg once daily and amlodipine  2.5 mg once daily. Previously tried:  BP goal: <130/80  Family History:   Social History:   Diet:   Exercise:  {types:28256}  Home BP readings:  Date SBP/DBP  HR                              Average      Wt Readings from Last 3 Encounters:  12/18/23 192 lb 6.4 oz (87.3 kg)  09/12/23 187 lb 9.8 oz (85.1 kg)  07/24/23 187 lb 9.6 oz (85.1 kg)   BP Readings from Last 3 Encounters:  12/20/23 139/79  12/18/23 138/78   11/11/23 (!) 150/80   Pulse Readings from Last 3 Encounters:  12/20/23 81  12/18/23 80  11/11/23 84    Renal function: Estimated Creatinine Clearance: 68.2 mL/min (by C-G formula based on SCr of 0.81 mg/dL).  Past Medical History:  Diagnosis Date   Anemia    Hyperlipidemia    Pre-diabetes    Seasonal allergies    Sleep apnea    Does not use CPAP every night   SVD (spontaneous vaginal delivery)    x 2    Current Outpatient Medications on File Prior to Visit  Medication Sig Dispense Refill   acetaminophen  (TYLENOL ) 500 MG tablet Take 2 tablets (1,000 mg total) by mouth every 8 (eight) hours. 30 tablet 0   Ascorbic Acid (VITAMIN C) 1000 MG tablet Take 1,000 mg by mouth daily.     atorvastatin  (LIPITOR) 40 MG tablet Take 1 tablet (40 mg total) by mouth daily. 90 tablet 3   carvedilol (COREG) 3.125 MG tablet Take 1 tablet (3.125 mg total) by mouth 2 (two) times daily with a meal. 60 tablet 0   celecoxib  (CELEBREX )  100 MG capsule Take 100 mg by mouth daily. Can take 2 tablets if needed (Patient taking differently: Take 100 mg by mouth 2 (two) times daily.)     Cholecalciferol (VITAMIN D3) 25 MCG (1000 UT) CAPS Take by mouth. 1 tab daily     diphenhydrAMINE  (BENADRYL ) 25 MG tablet Take 1 tablet (25 mg total) by mouth every 6 (six) hours as needed. 30 tablet 0   EPINEPHrine  0.3 mg/0.3 mL IJ SOAJ injection Inject 0.3 mg into the muscle as needed for anaphylaxis. 1 each 0   Evolocumab  (REPATHA  SURECLICK) 140 MG/ML SOAJ Inject 140 mg into the skin every 14 (fourteen) days. 2 mL 11   Loratadine (CLARITIN PO) Take by mouth. daily     Semaglutide , 1 MG/DOSE, 4 MG/3ML SOPN Inject 1 mg into the skin once a week. 3 mL 11   No current facility-administered medications on file prior to visit.    Allergies  Allergen Reactions   Contrast Media [Iodinated Contrast Media] Anaphylaxis    Throat swelling, required epinephrine  and benadryl  a long with monitoring in the ED.   Ibuprofen  Rash    Naproxen Sodium Rash    There were no vitals taken for this visit.   Assessment/Plan: No BP recorded.  {Refresh Note OR Click here to enter BP  :1}***   1. Hypertension -  No problem-specific Assessment & Plan notes found for this encounter.      Thank you  B. Maegan Genene Kilman, PharmD PGY-1 Pharmacy Resident [facility] 12/31/2023 9:59 AM   Melissa D Maccia, Pharm.JONETTA SARAN, CPP [facility] A Division of [facility] [address], [place], [place] [address]  Phone: [phone]; Fax: [phone]

## 2024-01-01 ENCOUNTER — Ambulatory Visit: Attending: Cardiovascular Disease | Admitting: Pharmacist

## 2024-01-01 ENCOUNTER — Other Ambulatory Visit (HOSPITAL_COMMUNITY): Payer: Self-pay

## 2024-01-01 VITALS — BP 144/72

## 2024-01-01 DIAGNOSIS — R030 Elevated blood-pressure reading, without diagnosis of hypertension: Secondary | ICD-10-CM | POA: Insufficient documentation

## 2024-01-01 MED ORDER — MOUNJARO 2.5 MG/0.5ML ~~LOC~~ SOAJ
2.5000 mg | SUBCUTANEOUS | 0 refills | Status: DC
  Filled 2024-01-01: qty 2, 28d supply, fill #0

## 2024-01-01 MED ORDER — AMLODIPINE BESYLATE 2.5 MG PO TABS: 1.2500 mg | ORAL_TABLET | Freq: Every day | ORAL | Status: DC

## 2024-01-01 NOTE — Patient Instructions (Signed)
 Your blood pressure goal is < 130/89mmHg   Continue one-half tablet of carvedilol 3.125 mg by mouth twice daily for a few days then increase to one full tablet twice daily Start amlodipine  1.25 mg (one-half tablet) once daily Continue to check your blood pressure and send readings to Melissa in a few weeks  Important lifestyle changes to control high blood pressure  Intervention  Effect on the BP   Weight loss Weight loss is one of the most effective lifestyle changes for controlling blood pressure. If you're overweight or obese, losing even a small amount of weight can help reduce blood pressure.    Blood pressure can decrease by 1 millimeter of mercury (mmHg) with each kilogram (about 2.2 pounds) of weight lost.   Exercise regularly As a general goal, aim for 30 minutes of moderate physical activity every day.    Regular physical activity can lower blood pressure by 5 - 8 mmHg.   Eat a healthy diet Eat a diet rich in whole grains, fruits, vegetables, lean meat, and low-fat dairy products. Limit processed foods, saturated fat, and sweets.    A heart-healthy diet can lower high blood pressure by 10 mmHg.   Reduce salt (sodium) in your diet Aim for 000mg  of sodium each day. Avoid deli meats, canned food, and frozen microwave meals which are high in sodium.     Limiting sodium can reduce blood pressure by 5 mmHg.   Limit alcohol One drink equals 12 ounces of beer, 5 ounces of wine, or 1.5 ounces of 80-proof liquor.    Limiting alcohol to < 1 drink a day for women or < 2 drinks a day for men can help lower blood pressure by about 4 mmHg.   To check your pressure at home you will need to:   Sit up in a chair, with feet flat on the floor and back supported. Do not cross your ankles or legs. Rest your left arm so that the cuff is about heart level. If the cuff goes on your upper arm, then just relax your arm on the table, arm of the chair, or your lap. If you have a wrist cuff,  hold your wrist against your chest at heart level. Place the cuff snugly around your arm, about 1 inch above the crease of your elbow. The cords should be inside the groove of your elbow.  Sit quietly, with the cuff in place, for about 5 minutes. Then press the power button to start a reading. Do not talk or move while the reading is taking place.  Record your readings on a sheet of paper. Although most cuffs have a memory, it is often easier to see a pattern developing when the numbers are all in front of you.  You can repeat the reading after 1-3 minutes if it is recommended.   Make sure your bladder is empty and you have not had caffeine or tobacco within the last 30 minutes   Always bring your blood pressure log with you to your appointments. If you have not brought your monitor in to be double checked for accuracy, please bring it to your next appointment.   You can find a list of validated (accurate) blood pressure cuffs at: validatebp.org

## 2024-01-01 NOTE — Assessment & Plan Note (Addendum)
 Assessment: BP at home has been above goal (BP<130/80) ranging from 130s-140s/70-80s BP in clinic today of 144/72 remains above goal of < 130/80 mmHg. Patient is currently on one-half tablet of carvedilol  3.125 mg twice daily Patient experienced side effects, such as dizziness and chest pain, with amlodipine  5 mg, chlorthalidone  25 mg and irbesartan  75 mg daily. She did tolerate amlodipine  2.5mg  daily.  Patient has increased her physical activity to help reduce blood pressure through lifestyle changes Plan: Start amlodipine  1.25 mg (one-half of 2.5 mg tablet) once daily Continue one-half tablet of carvedilol  3.125 mg twice daily and increase to 1 full-tablet twice daily in a few days to 1 week as tolerated Continue to maintain a low-sodium diet and exercise 150 minutes/week Send BP readings to [facility] via MyChart in 1-2 week and follow-up in clinic on 02/12/2024.

## 2024-01-14 MED ORDER — MOUNJARO 2.5 MG/0.5ML ~~LOC~~ SOAJ: 2.5000 mg | SUBCUTANEOUS | 0 refills | Status: DC

## 2024-01-24 MED ORDER — CARVEDILOL 3.125 MG PO TABS: 3.1250 mg | ORAL_TABLET | Freq: Two times a day (BID) | ORAL | 1 refills | Status: DC

## 2024-01-24 NOTE — Addendum Note (Signed)
 Addended by: Chloey Ricard D on: 01/24/2024 05:01 PM   Modules accepted: Orders

## 2024-02-03 ENCOUNTER — Ambulatory Visit

## 2024-02-04 ENCOUNTER — Ambulatory Visit
Admission: RE | Admit: 2024-02-04 | Discharge: 2024-02-04 | Disposition: A | Discharge status: Home / Self Care | Source: Ambulatory Visit | Attending: Internal Medicine | Admitting: Internal Medicine

## 2024-02-04 DIAGNOSIS — Z1231 Encounter for screening mammogram for malignant neoplasm of breast: Secondary | ICD-10-CM

## 2024-02-08 ENCOUNTER — Other Ambulatory Visit: Payer: Self-pay | Admitting: Internal Medicine

## 2024-02-12 ENCOUNTER — Ambulatory Visit: Attending: Internal Medicine | Admitting: Pharmacist

## 2024-02-12 VITALS — BP 128/70 | HR 86

## 2024-02-12 DIAGNOSIS — E6609 Other obesity due to excess calories: Secondary | ICD-10-CM | POA: Insufficient documentation

## 2024-02-12 DIAGNOSIS — Z6830 Body mass index (BMI) 30.0-30.9, adult: Secondary | ICD-10-CM | POA: Insufficient documentation

## 2024-02-12 DIAGNOSIS — E66811 Obesity, class 1: Secondary | ICD-10-CM | POA: Insufficient documentation

## 2024-02-12 DIAGNOSIS — E782 Mixed hyperlipidemia: Secondary | ICD-10-CM | POA: Insufficient documentation

## 2024-02-12 DIAGNOSIS — R030 Elevated blood-pressure reading, without diagnosis of hypertension: Secondary | ICD-10-CM | POA: Insufficient documentation

## 2024-02-12 LAB — BASIC METABOLIC PANEL WITH GFR
BUN/Creatinine Ratio: 19 (ref 12–28)
BUN: 16 mg/dL (ref 8–27)
CO2: 23 mmol/L (ref 20–29)
Calcium: 9.7 mg/dL (ref 8.7–10.3)
Chloride: 100 mmol/L (ref 96–106)
Creatinine, Ser: 0.83 mg/dL (ref 0.57–1.00)
Glucose: 83 mg/dL (ref 70–99)
Potassium: 4.1 mmol/L (ref 3.5–5.2)
Sodium: 138 mmol/L (ref 134–144)
eGFR: 77 mL/min/1.73

## 2024-02-12 MED ORDER — LISINOPRIL 10 MG PO TABS: 10.0000 mg | ORAL_TABLET | Freq: Every day | ORAL | 11 refills | Status: AC

## 2024-02-12 MED ORDER — MOUNJARO 5 MG/0.5ML ~~LOC~~ SOAJ: 5.0000 mg | SUBCUTANEOUS | 0 refills | Status: AC

## 2024-02-12 MED ORDER — HYDROCHLOROTHIAZIDE 12.5 MG PO CAPS: 12.5000 mg | ORAL_CAPSULE | Freq: Every day | ORAL | 11 refills | Status: AC

## 2024-02-12 NOTE — Progress Notes (Signed)
 Patient ID: [name]                 DOB: September 21, 1955                      MRN: [id number]      HPI: [name] is a 68 y.o. female referred by [name] to HTN clinic. PMH is significant for T2DM, HLD, OSA, and CKD3.  Patient was last seen in office by [name] on 11/11/23. BP at this visit was 150/80 with HR 84 on amlodipine  2.5 mg once daily. During this visit, the patient stated she takes all of her oral medications at night, which may be contributing to her headaches. She started taking the atorvastatin  and one of the celecoxib  capsules in the morning and the amlodipine  and other celecoxib  capsule at night. She also stated that she had increased her exercise by participating in tai chi classes and working with a trainer twice weekly. Patient was educated on how to correctly take her BP and instructed to monitor her BP over the next 2 weeks to assess need to adjust medications.  [name] checked in with patient on 10/14. Home BP readings provided ranged from 118-168/70s-80s. Amlodipine  was continued at 2.5 mg due to patient report of feeling dizzy/lightheaded on amlodipine  5 mg and irbesartan  75 mg once daily was added.  Patient was last seen in office by [name] on 12/18/2023. She had fluctuating BP readings from SBP 130s-150s/160s. At this visit, amlodipine  was stopped and chlorthalidone  25 mg once daily was started.   Patient [facility] to [facility] ED on 12/20/23 for dizziness, which she reported had gotten worse since starting the chlorthalidone , and R-sided chest pain. Chlorthalidone  was discontinued and carvedilol  3.125 mg BID was started.  Patient was referred to PharmD HTN clinic for BP management and GLP-1 transition from Ozempic  to Mounjaro , as she has not seem much weight loss on the Ozempic  1 mg/week dose. I saw her last 01/01/24. She was transitioned to Mounjaro  in Dec because she has recently filled Ozempic . BP at home was 130-140/70-80's on 1.56mg  of carvedilol   BID. Amlodipine  1.25mg  dailly was added and patient was asked to try to increase to 3.125mg  BID of carvedilol  twice a day.  Patient presents today for follow-up.  She reports that she did not do well with the increased dose of carvedilol .  She tried taking a whole tablet at night and a half a tablet in the morning but had increased chest pain.  Also increased chest pain with adding 1.25 mg of amlodipine .  She reports that although she knows she is not supposed to, she tried her husband's lisinopril (10 mg) and hydrochlorothiazide(12.5 mg).  One day she tried losartan but had chest pressure.  She denies any dizziness or lightheadedness, headaches or swelling.  She is trying to walk more as she realized walking in the cold is not as bad as she thought.  Has not been as consistent with her exercise in December but still doing her tai chi and working out with a psychologist, educational.  Does not feel like the 2.5 of Mounjaro  is doing much.  She was previously on Ozempic  1 mg weekly.  Due to her sensitivities to medication it was decided to transition her to the starting dose of Mounjaro  instead of 5 mg just to be cautious.  Advised not surprised that she is not seeing much effect with the 2.5.  It is time for her to increase to 5  mg so I will send the new prescription in.   Current HTN meds: hydrochlorothiazide 12.5mg  daily, lisinopril 10mg  daily Previously tried: amlodipine  5 mg(dizzy), amlodipine  1.25mg  (chest pain), chlorthalidone  25 mg (chest pain), irbesartan  (chest pain) BP goal: <130/80  Family History:   Social History:  Tobacco: None Alcohol: 1 glass of wine a few nights a week Diet:  Breakfast: yogurt, fruit, half a bagel Lunch/Dinner: Roasted/baked chicken, vegetables, chicken sausage dog with mustard or salad Exercise:  Tai chi 2-3 times weekly, exercises with a personal trainer twice weekly (strength, cardio)  Home BP readings:  Date SBP/DBP  HR  12/27 132/77   12/28 128/72   12/29 124/67    12/30 119/60   112/31 133/69           Average      Wt Readings from Last 3 Encounters:  12/18/23 192 lb 6.4 oz (87.3 kg)  09/12/23 187 lb 9.8 oz (85.1 kg)  07/24/23 187 lb 9.6 oz (85.1 kg)   BP Readings from Last 3 Encounters:  02/12/24 128/70  01/01/24 (!) 144/72  12/20/23 139/79   Pulse Readings from Last 3 Encounters:  02/12/24 86  12/20/23 81  12/18/23 80    Renal function: CrCl cannot be calculated (Patient's most recent lab result is older than the maximum 21 days allowed.).  Past Medical History:  Diagnosis Date   Anemia    Hyperlipidemia    Pre-diabetes    Seasonal allergies    Sleep apnea    Does not use CPAP every night   SVD (spontaneous vaginal delivery)    x 2    Current Outpatient Medications on File Prior to Visit  Medication Sig Dispense Refill   amLODipine  (NORVASC ) 2.5 MG tablet Take 0.5 tablets (1.25 mg total) by mouth daily.     Ascorbic Acid (VITAMIN C) 1000 MG tablet Take 1,000 mg by mouth daily.     atorvastatin  (LIPITOR) 40 MG tablet Take 1 tablet (40 mg total) by mouth daily. 90 tablet 3   carvedilol  (COREG ) 3.125 MG tablet Take 1 tablet (3.125 mg total) by mouth 2 (two) times daily with a meal. 60 tablet 1   celecoxib  (CELEBREX ) 100 MG capsule Take 100 mg by mouth daily. Can take 2 tablets if needed (Patient taking differently: Take 100 mg by mouth 2 (two) times daily.)     Cholecalciferol (VITAMIN D3) 25 MCG (1000 UT) CAPS Take by mouth. 1 tab daily     EPINEPHrine  0.3 mg/0.3 mL IJ SOAJ injection Inject 0.3 mg into the muscle as needed for anaphylaxis. 1 each 0   Evolocumab  (REPATHA  SURECLICK) 140 MG/ML SOAJ INJECT 140 MG INTO THE SKIN EVERY 14 (FOURTEEN) DAYS. 6 mL 3   Loratadine (CLARITIN PO) Take by mouth. daily     No current facility-administered medications on file prior to visit.    Allergies  Allergen Reactions   Contrast Media [Iodinated Contrast Media] Anaphylaxis    Throat swelling, required epinephrine  and benadryl  a  long with monitoring in the ED.   Ibuprofen  Rash   Naproxen Sodium Rash    Blood pressure 128/70, pulse 86.   Assessment/Plan:     1. Hypertension -  Elevated blood pressure reading Assessment: Blood pressure improving on hydrochlorothiazide 12.5 mg and lisinopril 10 mg Has only taken for 5 days Will need BMP Has not been as consistent with exercise but is working to incorporate more Blood pressure good in clinic today at 128/70  Plan: Check BMP Continue lisinopril 10 mg  daily and hydrochlorothiazide 12.5 mg daily as long as labs are okay Follow-up in 6 weeks   Thank you    Mikhael Hendriks D Adreona Brand, Pharm.JONETTA SARAN, CPP [facility] A Division of [facility] [address] [place], [place] [address]  Phone: [phone]; Fax: [phone]

## 2024-02-12 NOTE — Patient Instructions (Signed)
 Please stop by the lab today for a BMP Please continue hydrochlorothiazide 12.5mg  daily and lisinopril 10mg  daily Continue to monitor blood pressure and record readings Follow up in Feb

## 2024-02-12 NOTE — Assessment & Plan Note (Signed)
 Assessment: Blood pressure improving on hydrochlorothiazide 12.5 mg and lisinopril 10 mg Has only taken for 5 days Will need BMP Has not been as consistent with exercise but is working to incorporate more Blood pressure good in clinic today at 128/70  Plan: Check BMP Continue lisinopril 10 mg daily and hydrochlorothiazide 12.5 mg daily as long as labs are okay Follow-up in 6 weeks

## 2024-02-14 ENCOUNTER — Ambulatory Visit: Payer: Self-pay | Admitting: Pharmacist

## 2024-02-20 ENCOUNTER — Encounter: Payer: Self-pay | Admitting: Pharmacist

## 2024-02-25 NOTE — Telephone Encounter (Signed)
 This was already addressed via telephone

## 2024-03-15 ENCOUNTER — Encounter (HOSPITAL_COMMUNITY): Payer: Self-pay

## 2024-03-15 ENCOUNTER — Emergency Department (HOSPITAL_COMMUNITY): Admission: EM | Admit: 2024-03-15 | Discharge: 2024-03-15 | Disposition: A | Discharge status: Home / Self Care

## 2024-03-15 ENCOUNTER — Other Ambulatory Visit: Payer: Self-pay

## 2024-03-15 DIAGNOSIS — R350 Frequency of micturition: Secondary | ICD-10-CM | POA: Diagnosis present

## 2024-03-15 DIAGNOSIS — R300 Dysuria: Secondary | ICD-10-CM | POA: Diagnosis not present

## 2024-03-15 DIAGNOSIS — R140 Abdominal distension (gaseous): Secondary | ICD-10-CM | POA: Diagnosis not present

## 2024-03-15 DIAGNOSIS — N760 Acute vaginitis: Secondary | ICD-10-CM

## 2024-03-15 LAB — COMPREHENSIVE METABOLIC PANEL WITH GFR
ALT: 33 U/L (ref 0–44)
AST: 27 U/L (ref 15–41)
Albumin: 4.6 g/dL (ref 3.5–5.0)
Alkaline Phosphatase: 66 U/L (ref 38–126)
Anion gap: 9 (ref 5–15)
BUN: 20 mg/dL (ref 8–23)
CO2: 29 mmol/L (ref 22–32)
Calcium: 9.9 mg/dL (ref 8.9–10.3)
Chloride: 101 mmol/L (ref 98–111)
Creatinine, Ser: 0.91 mg/dL (ref 0.44–1.00)
GFR, Estimated: >60 mL/min
Glucose, Bld: 94 mg/dL (ref 70–99)
Potassium: 4.3 mmol/L (ref 3.5–5.1)
Sodium: 140 mmol/L (ref 135–145)
Total Bilirubin: <0.2 mg/dL (ref 0.0–1.2)
Total Protein: 7.5 g/dL (ref 6.5–8.1)

## 2024-03-15 LAB — URINALYSIS, W/ REFLEX TO CULTURE (INFECTION SUSPECTED)
Bacteria, UA: NONE SEEN
Bilirubin Urine: NEGATIVE
Glucose, UA: NEGATIVE mg/dL
Hgb urine dipstick: NEGATIVE
Ketones, ur: NEGATIVE mg/dL
Leukocytes,Ua: NEGATIVE
Nitrite: NEGATIVE
Protein, ur: NEGATIVE mg/dL
Specific Gravity, Urine: 1.009 (ref 1.005–1.030)
pH: 8 (ref 5.0–8.0)

## 2024-03-15 LAB — LIPASE, BLOOD: Lipase: 36 U/L (ref 11–51)

## 2024-03-15 LAB — CBC WITH DIFFERENTIAL/PLATELET
Abs Immature Granulocytes: 0.01 10*3/uL (ref 0.00–0.07)
Basophils Absolute: 0 10*3/uL (ref 0.0–0.1)
Basophils Relative: 1 %
Eosinophils Absolute: 0.2 10*3/uL (ref 0.0–0.5)
Eosinophils Relative: 3 %
HCT: 39.4 % (ref 36.0–46.0)
Hemoglobin: 12.7 g/dL (ref 12.0–15.0)
Immature Granulocytes: 0 %
Lymphocytes Relative: 42 %
Lymphs Abs: 2.5 10*3/uL (ref 0.7–4.0)
MCH: 26.1 pg (ref 26.0–34.0)
MCHC: 32.2 g/dL (ref 30.0–36.0)
MCV: 80.9 fL (ref 80.0–100.0)
Monocytes Absolute: 0.6 10*3/uL (ref 0.1–1.0)
Monocytes Relative: 11 %
Neutro Abs: 2.6 10*3/uL (ref 1.7–7.7)
Neutrophils Relative %: 43 %
Platelets: 299 10*3/uL (ref 150–400)
RBC: 4.87 MIL/uL (ref 3.87–5.11)
RDW: 15.3 % (ref 11.5–15.5)
WBC: 5.9 10*3/uL (ref 4.0–10.5)
nRBC: 0 % (ref 0.0–0.2)

## 2024-03-15 LAB — WET PREP, GENITAL
Clue Cells Wet Prep HPF POC: NONE SEEN
Sperm: NONE SEEN
Trich, Wet Prep: NONE SEEN
WBC, Wet Prep HPF POC: <10
Yeast Wet Prep HPF POC: NONE SEEN

## 2024-03-15 MED ORDER — ALUM & MAG HYDROXIDE-SIMETH 200-200-20 MG/5ML PO SUSP
30.0000 mL | Freq: Once | ORAL | Status: DC
  Filled 2024-03-15: qty 30

## 2024-03-15 NOTE — Discharge Instructions (Addendum)
 Please follow-up with your primary doctor.  Return for fevers, chills, uncontrolled nausea vomiting, stop urinating, severe pain or develop any new or worsening symptoms that are concerning to you.

## 2024-03-15 NOTE — ED Provider Notes (Signed)
 " [place] [facility] AT [facility] Provider Note   CSN: [id number] Arrival date & time: 03/15/24  1311     Patient presents with: Possible UTI and Urinary Frequency   [name] is a 69 y.o. female.   This is a 69 year old female presenting emergency department with concern for urinary tract infection.  Reports urinary frequency and perineal discomfort x 1 week. Describes as burning/itching. No nausea no vomiting.  Reports a history of UTI and feels similar.  No flank pain.  Does note that she has some bloating discomfort in her epigastrium, but not specifically pain. Denis vaginal discharge. No concern for STI.    Urinary Frequency       Prior to Admission medications  Medication Sig Start Date End Date Taking? Authorizing Provider  Ascorbic Acid (VITAMIN C) 1000 MG tablet Take 1,000 mg by mouth daily.    [provider]  atorvastatin  (LIPITOR) 40 MG tablet Take 1 tablet (40 mg total) by mouth daily. 09/30/23 12/29/23  Santo Stanly LABOR, MD  celecoxib  (CELEBREX ) 100 MG capsule Take 100 mg by mouth daily. Can take 2 tablets if needed Patient taking differently: Take 100 mg by mouth 2 (two) times daily.    [provider]  Cholecalciferol (VITAMIN D3) 25 MCG (1000 UT) CAPS Take by mouth. 1 tab daily    [provider]  EPINEPHrine  0.3 mg/0.3 mL IJ SOAJ injection Inject 0.3 mg into the muscle as needed for anaphylaxis. 08/26/20   Aberman, Caroline C, PA-C  Evolocumab  (REPATHA  SURECLICK) 140 MG/ML SOAJ INJECT 140 MG INTO THE SKIN EVERY 14 (FOURTEEN) DAYS. 02/10/24   Chandrasekhar, Stanly LABOR, MD  hydrochlorothiazide  (MICROZIDE ) 12.5 MG capsule Take 1 capsule (12.5 mg total) by mouth daily. 02/12/24   Maccia, Melissa D, RPH-CPP  lisinopril  (ZESTRIL ) 10 MG tablet Take 1 tablet (10 mg total) by mouth daily. 02/12/24   Maccia, Melissa D, RPH-CPP  Loratadine (CLARITIN PO) Take by mouth. daily    [provider]  tirzepatide   (MOUNJARO ) 5 MG/0.5ML Pen Inject 5 mg into the skin once a week. 02/12/24   Maccia, Melissa D, RPH-CPP    Allergies: Contrast media [iodinated contrast media], Ibuprofen , and Naproxen sodium    Review of Systems  Genitourinary:  Positive for frequency.    Updated Vital Signs BP (!) 165/78 (BP Location: Left Arm)   Pulse 88   Temp 98.1 F (36.7 C) (Oral)   Resp 18   SpO2 100%   Physical Exam Vitals and nursing note reviewed.  Constitutional:      General: She is not in acute distress.    Appearance: She is not toxic-appearing.  HENT:     Head: Normocephalic.     Nose: Nose normal.     Mouth/Throat:     Mouth: Mucous membranes are moist.  Eyes:     Conjunctiva/sclera: Conjunctivae normal.  Cardiovascular:     Rate and Rhythm: Normal rate and regular rhythm.  Pulmonary:     Effort: Pulmonary effort is normal.  Abdominal:     General: Abdomen is flat. There is no distension.     Tenderness: There is no abdominal tenderness. There is no guarding or rebound.  Musculoskeletal:        General: Normal range of motion.  Skin:    General: Skin is warm and dry.     Capillary Refill: Capillary refill takes less than 2 seconds.  Neurological:     Mental Status: She is alert.  Psychiatric:  Mood and Affect: Mood normal.        Behavior: Behavior normal.     (all labs ordered are listed, but only abnormal results are displayed) Labs Reviewed  URINALYSIS, W/ REFLEX TO CULTURE (INFECTION SUSPECTED) - Abnormal; Notable for the following components:      Result Value   Color, Urine STRAW (*)    All other components within normal limits  WET PREP, GENITAL  CBC WITH DIFFERENTIAL/PLATELET  COMPREHENSIVE METABOLIC PANEL WITH GFR  LIPASE, BLOOD    EKG: None  Radiology: No results found.   Procedures   Medications Ordered in the ED  alum & mag hydroxide-simeth (MAALOX/MYLANTA) 200-200-20 MG/5ML suspension 30 mL (has no administration in time range)    Clinical  Course as of 03/15/24 1509  Sun Mar 15, 2024  1410 Urinalysis, w/ Reflex to Culture (Infection Suspected) -Urine, Clean Catch(!) [TY]    Clinical Course User Index [TY] Neysa Caron PARAS, DO                                 Medical Decision Making Is a well-appearing 69 year old female presenting emergency department for concern for urinary tract infection.  Has a history of anemia, history of hysterectomy.  Well-appearing on exam.  Reassuring vitals.  Benign abdominal exam.  Will get screening labs given her epigastric discomfort/bloating.  Sending urine as well as wet prep to evaluate for bacterial infection/fungal infection.  See ED course for further MDM and final disposition.  UA without evidence of urinary tract infection.  Wet prep without infectious process.  No leukocytosis to suggest systemic infection.  No anemia.  Comprehensive panel with no transaminitis or elevated bilirubin to suggest acute biliary disease.  Lipase normal.  Pancreatitis unlikely.  Offered pelvic exam, patient declined and would like to follow-up with her OB/GYN.  Discussed possible atrophic vaginitis or hormonal related changes causing her symptoms today.  Given return precautions.  Discharged in stable condition.  Amount and/or Complexity of Data Reviewed Labs: ordered. Decision-making details documented in ED Course.  Risk OTC drugs.      Final diagnoses:  Dysuria    ED Discharge Orders     None          Neysa Caron PARAS, DO 03/15/24 1509  "

## 2024-03-15 NOTE — ED Triage Notes (Signed)
 Pt POV due to concerns of possible UTI, and yeast infection. Pt c/o perianal pain/ discomfort, and increase urinate frequency x1 week. Pt denies pain with urination.

## 2024-04-02 ENCOUNTER — Ambulatory Visit: Admitting: Pharmacist

## 2024-07-23 ENCOUNTER — Ambulatory Visit: Admitting: Endocrinology
